# Patient Record
Sex: Female | Born: 1980 | Race: Black or African American | Hispanic: No | Marital: Single | Smoking: Current every day smoker
Health system: Southern US, Community
[De-identification: ages and names within clinical notes are randomized; demographics above are authoritative.]

## PROBLEM LIST (undated history)

## (undated) DIAGNOSIS — M4800 Spinal stenosis, site unspecified: Secondary | ICD-10-CM

## (undated) DIAGNOSIS — F29 Unspecified psychosis not due to a substance or known physiological condition: Secondary | ICD-10-CM

## (undated) DIAGNOSIS — M549 Dorsalgia, unspecified: Secondary | ICD-10-CM

## (undated) DIAGNOSIS — Z720 Tobacco use: Secondary | ICD-10-CM

## (undated) DIAGNOSIS — M1990 Unspecified osteoarthritis, unspecified site: Secondary | ICD-10-CM

## (undated) DIAGNOSIS — F32A Depression, unspecified: Secondary | ICD-10-CM

## (undated) DIAGNOSIS — D573 Sickle-cell trait: Secondary | ICD-10-CM

## (undated) DIAGNOSIS — F329 Major depressive disorder, single episode, unspecified: Secondary | ICD-10-CM

## (undated) DIAGNOSIS — J45909 Unspecified asthma, uncomplicated: Secondary | ICD-10-CM

## (undated) HISTORY — PX: CHOLECYSTECTOMY: SHX55

---
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
## 2003-04-07 ENCOUNTER — Inpatient Hospital Stay (HOSPITAL_COMMUNITY): Admission: AD | Admit: 2003-04-07 | Discharge: 2003-04-08 | Payer: Self-pay | Admitting: Obstetrics & Gynecology

## 2003-04-08 ENCOUNTER — Encounter: Payer: Self-pay | Admitting: Family Medicine

## 2015-11-15 ENCOUNTER — Encounter (HOSPITAL_COMMUNITY): Payer: Self-pay | Admitting: Emergency Medicine

## 2015-11-15 ENCOUNTER — Emergency Department (HOSPITAL_COMMUNITY)
Admission: EM | Admit: 2015-11-15 | Discharge: 2015-11-15 | Disposition: A | Discharge status: Home / Self Care | Payer: Medicaid Other | Attending: Physician Assistant | Admitting: Physician Assistant

## 2015-11-15 DIAGNOSIS — M545 Low back pain, unspecified: Secondary | ICD-10-CM

## 2015-11-15 HISTORY — DX: Dorsalgia, unspecified: M54.9

## 2015-11-15 HISTORY — DX: Unspecified osteoarthritis, unspecified site: M19.90

## 2015-11-15 MED ORDER — KETOROLAC TROMETHAMINE 60 MG/2ML IM SOLN
30.0000 mg | Freq: Once | INTRAMUSCULAR | Status: AC
  Administered 2015-11-15: 30 mg via INTRAMUSCULAR
  Filled 2015-11-15: qty 2

## 2015-11-15 MED ORDER — IBUPROFEN 800 MG PO TABS: 800.0000 mg | ORAL_TABLET | Freq: Three times a day (TID) | ORAL | Status: DC

## 2015-11-15 MED ORDER — METHOCARBAMOL 500 MG PO TABS: 500.0000 mg | ORAL_TABLET | Freq: Two times a day (BID) | ORAL | Status: DC

## 2015-11-15 NOTE — ED Notes (Signed)
Pt states she has a hx of back problems. Last night began having lower back pain that has been worsening throughout the day. Denies any incontinence, states shooting pain down right leg makes it tingle intermittently. Able to walk into triage.

## 2015-11-15 NOTE — Discharge Instructions (Signed)

## 2015-11-15 NOTE — ED Provider Notes (Signed)
CSN: [id number]     Arrival date & time 11/15/15  1322 History  By signing my name below, I, Phillis Haggis, attest that this documentation has been prepared under the direction and in the presence of Fayrene Helper, PA-C.  Electronically Signed: Phillis Haggis, ED Scribe. 11/15/2015. 1:54 PM.   Chief Complaint  Patient presents with  . Back Pain   The history is provided by the patient. No language interpreter was used.  HPI Comments: Norma Moreno is a 35 y.o. Female with a hx of spinal stenosis, back pain and arthritis who presents to the Emergency Department complaining of gradually worsening, intermittent, sharp,shooting, dull lower back pain that radiates down right leg onset one day ago. Pt has a hx of spinal stenosis that has caused intermittent pain over the past five years. Pt reports that she began a new job Monday that requires her to stand for long periods. She rates her pain 10/10 that worsens with standing and sitting. She has not taken anything for her pain PTA. She denies bladder or bowel incontinence, fever, groin numbness, hematuria, abdominal pain, hx of IV drug use, or hx of cancer.   Past Medical History  Diagnosis Date  . Arthritis   . Back pain    Past Surgical History  Procedure Laterality Date  . Cholecystectomy    . Cesarean section      x 4   History reviewed. No pertinent family history. Social History  Substance Use Topics  . Smoking status: None  . Smokeless tobacco: None  . Alcohol Use: None   OB History    No data available     Review of Systems  Constitutional: Negative for fever.  Gastrointestinal: Negative for abdominal pain.  Genitourinary: Negative for dysuria and hematuria.  Musculoskeletal: Positive for back pain.  Neurological: Negative for weakness and numbness.   Allergies  Review of patient's allergies indicates no known allergies.  Home Medications   Prior to Admission medications   Not on File   BP 119/72 mmHg  Pulse 73   Temp(Src) 97.8 F (36.6 C) (Oral)  Resp 16  SpO2 100% Physical Exam  Constitutional: She is oriented to person, place, and time. She appears well-developed and well-nourished.  HENT:  Head: Normocephalic and atraumatic.  Eyes: Conjunctivae and EOM are normal. Pupils are equal, round, and reactive to light.  Neck: Normal range of motion. Neck supple.  Musculoskeletal:       Right hip: Normal.       Left hip: Normal.       Right knee: Normal.       Left knee: Normal.       Right ankle: Normal.       Left ankle: Normal.  Tenderness to palpation to lumbar and paralumbar spine; no crepitus, step offs, or overlying skin changes; ambulating with a limp; NVI; brisk cap refill  Neurological: She is alert and oriented to person, place, and time. Coordination normal.  Intact patellar reflexes bilaterally; no foot drop; equal 5/5 strength bilaterally and sensation intact  Skin: Skin is warm and dry.  Psychiatric: She has a normal mood and affect. Her behavior is normal.  Nursing note and vitals reviewed.   ED Course  Procedures (including critical care time) DIAGNOSTIC STUDIES: Oxygen Saturation is 100% on RA, normal by my interpretation.    COORDINATION OF CARE: 1:52 PM-Discussed treatment plan which includes pain medication with pt at bedside and pt agreed to plan.    MDM   Patient with  back pain.  No neurological deficits and normal neuro exam.  Patient can walk but states is painful.  No loss of bowel or bladder control.  No concern for cauda equina.  No fever, night sweats, weight loss, h/o cancer, IVDU.  RICE protocol, Robaxin and ibuprofen indicated and discussed with patient. Will give referral to orthopedics  Final diagnoses:  Bilateral low back pain without sciatica   BP 119/72 mmHg  Pulse 73  Temp(Src) 97.8 F (36.6 C) (Oral)  Resp 16  SpO2 100%  I personally performed the services described in this documentation, which was scribed in my presence. The recorded  information has been reviewed and is accurate.      Fayrene HelperBowie Roneisha Stern, PA-C 11/15/15 1412  Courteney Lyn Mackuen, MD 11/15/15 1614

## 2015-11-15 NOTE — ED Notes (Signed)
Attempt to call PT back for triage. PT did not answer

## 2015-11-21 ENCOUNTER — Emergency Department (HOSPITAL_COMMUNITY): Payer: Medicaid Other

## 2015-11-21 ENCOUNTER — Emergency Department (HOSPITAL_COMMUNITY)
Admission: EM | Admit: 2015-11-21 | Discharge: 2015-11-21 | Disposition: A | Discharge status: Home / Self Care | Payer: Medicaid Other | Attending: Emergency Medicine | Admitting: Emergency Medicine

## 2015-11-21 ENCOUNTER — Encounter (HOSPITAL_COMMUNITY): Payer: Self-pay

## 2015-11-21 DIAGNOSIS — Z3202 Encounter for pregnancy test, result negative: Secondary | ICD-10-CM | POA: Insufficient documentation

## 2015-11-21 DIAGNOSIS — R69 Illness, unspecified: Secondary | ICD-10-CM

## 2015-11-21 DIAGNOSIS — Z79899 Other long term (current) drug therapy: Secondary | ICD-10-CM | POA: Insufficient documentation

## 2015-11-21 DIAGNOSIS — J111 Influenza due to unidentified influenza virus with other respiratory manifestations: Secondary | ICD-10-CM | POA: Diagnosis not present

## 2015-11-21 DIAGNOSIS — F17200 Nicotine dependence, unspecified, uncomplicated: Secondary | ICD-10-CM | POA: Insufficient documentation

## 2015-11-21 DIAGNOSIS — R509 Fever, unspecified: Secondary | ICD-10-CM | POA: Diagnosis present

## 2015-11-21 DIAGNOSIS — M1990 Unspecified osteoarthritis, unspecified site: Secondary | ICD-10-CM | POA: Diagnosis not present

## 2015-11-21 LAB — COMPREHENSIVE METABOLIC PANEL
ALBUMIN: 3.7 g/dL (ref 3.5–5.0)
ALT: 12 U/L — ABNORMAL LOW (ref 14–54)
ANION GAP: 8 (ref 5–15)
AST: 21 U/L (ref 15–41)
Alkaline Phosphatase: 50 U/L (ref 38–126)
BUN: 6 mg/dL (ref 6–20)
CHLORIDE: 111 mmol/L (ref 101–111)
CO2: 23 mmol/L (ref 22–32)
Calcium: 8.7 mg/dL — ABNORMAL LOW (ref 8.9–10.3)
Creatinine, Ser: 0.95 mg/dL (ref 0.44–1.00)
GFR calc Af Amer: >60 mL/min (ref >60)
Glucose, Bld: 90 mg/dL (ref 65–99)
POTASSIUM: 2.9 mmol/L — AB (ref 3.5–5.1)
Sodium: 142 mmol/L (ref 135–145)
TOTAL PROTEIN: 7.6 g/dL (ref 6.5–8.1)
Total Bilirubin: 0.2 mg/dL — ABNORMAL LOW (ref 0.3–1.2)

## 2015-11-21 LAB — CBC
HEMATOCRIT: 32.8 % — AB (ref 36.0–46.0)
HEMOGLOBIN: 11.2 g/dL — AB (ref 12.0–15.0)
MCH: 27.7 pg (ref 26.0–34.0)
MCHC: 34.1 g/dL (ref 30.0–36.0)
MCV: 81.2 fL (ref 78.0–100.0)
Platelets: 193 10*3/uL (ref 150–400)
RBC: 4.04 MIL/uL (ref 3.87–5.11)
RDW: 15.9 % — ABNORMAL HIGH (ref 11.5–15.5)
WBC: 5.6 10*3/uL (ref 4.0–10.5)

## 2015-11-21 LAB — URINALYSIS, ROUTINE W REFLEX MICROSCOPIC
Bilirubin Urine: NEGATIVE
Glucose, UA: NEGATIVE mg/dL
KETONES UR: NEGATIVE mg/dL
LEUKOCYTES UA: NEGATIVE
NITRITE: NEGATIVE
PH: 6 (ref 5.0–8.0)
Protein, ur: NEGATIVE mg/dL
SPECIFIC GRAVITY, URINE: 1.02 (ref 1.005–1.030)

## 2015-11-21 LAB — URINE MICROSCOPIC-ADD ON: WBC UA: NONE SEEN WBC/hpf (ref 0–5)

## 2015-11-21 LAB — I-STAT BETA HCG BLOOD, ED (MC, WL, AP ONLY): I-stat hCG, quantitative: <5 m[IU]/mL (ref <5)

## 2015-11-21 LAB — LIPASE, BLOOD: LIPASE: 42 U/L (ref 11–51)

## 2015-11-21 MED ORDER — ACETAMINOPHEN 500 MG PO TABS
1000.0000 mg | ORAL_TABLET | Freq: Once | ORAL | Status: AC
  Administered 2015-11-21: 1000 mg via ORAL
  Filled 2015-11-21: qty 2

## 2015-11-21 MED ORDER — SODIUM CHLORIDE 0.9 % IV BOLUS (SEPSIS)
1000.0000 mL | Freq: Once | INTRAVENOUS | Status: AC
  Administered 2015-11-21: 1000 mL via INTRAVENOUS

## 2015-11-21 MED ORDER — POTASSIUM CHLORIDE CRYS ER 20 MEQ PO TBCR
40.0000 meq | EXTENDED_RELEASE_TABLET | Freq: Once | ORAL | Status: AC
  Administered 2015-11-21: 40 meq via ORAL
  Filled 2015-11-21: qty 2

## 2015-11-21 MED ORDER — ONDANSETRON HCL 4 MG/2ML IJ SOLN
4.0000 mg | Freq: Once | INTRAMUSCULAR | Status: AC
  Administered 2015-11-21: 4 mg via INTRAVENOUS
  Filled 2015-11-21: qty 2

## 2015-11-21 MED ORDER — KETOROLAC TROMETHAMINE 30 MG/ML IJ SOLN
30.0000 mg | Freq: Once | INTRAMUSCULAR | Status: AC
  Administered 2015-11-21: 30 mg via INTRAVENOUS
  Filled 2015-11-21: qty 1

## 2015-11-21 NOTE — Discharge Instructions (Signed)

## 2015-11-21 NOTE — ED Notes (Signed)
Pt continues with back pain which she was seen for 6 days ago.  Pt states emesis starting today.  No abdominal pain.  No change in urination.  No fever

## 2015-11-21 NOTE — Progress Notes (Signed)
Patient listed as not having insurance or a pcp.  [name] spoke to patient at bedside.  Patient reports she has Medicaid insurance.  Pcp listed on patient's insurance card is located at the [facility].  Patient was not aware of this.  [facility] with [facility].  [facility] encouraged patient to call the DSS to see which pcp has been assigned to her.  Patient also provided phone number for the DSS, Medicaid transport and list of pc[place] who [place].  Patient thankful for services.  No further [name] needs at this time.

## 2015-11-21 NOTE — ED Provider Notes (Signed)
CSN: [id number]     Arrival date & time 11/21/15  1407 History   First MD Initiated Contact with Patient 11/21/15 1712     Chief Complaint  Patient presents with  . Back Pain  . Emesis     (Consider location/radiation/quality/duration/timing/severity/associated sxs/prior Treatment) Patient is a 35 y.o. female presenting with back pain, vomiting, and general illness. The history is provided by the patient.  Back Pain Associated symptoms: fever   Associated symptoms: no chest pain, no dysuria and no headaches   Emesis Associated symptoms: myalgias   Associated symptoms: no arthralgias, no chills and no headaches   Illness Severity:  Moderate Onset quality:  Gradual Duration:  2 days Timing:  Constant Progression:  Worsening Chronicity:  New Associated symptoms: congestion, cough, fever, myalgias and vomiting   Associated symptoms: no chest pain, no headaches, no nausea, no rhinorrhea, no shortness of breath and no wheezing    35 yo With a chief complaint of cough congestion fevers chills myalgias. Going on for the past couple days. Her son has the same illness. Has had some shortness of breath with it. Has been feeling generally ill. Was recently seen for right-sided low back pain with radiation down her leg. This is unchanged since that time. Denies loss of bowel or bladder.  Past Medical History  Diagnosis Date  . Arthritis   . Back pain    Past Surgical History  Procedure Laterality Date  . Cholecystectomy    . Cesarean section      x 4   History reviewed. No pertinent family history. Social History  Substance Use Topics  . Smoking status: Current Some Day Smoker  . Smokeless tobacco: None  . Alcohol Use: No   OB History    No data available     Review of Systems  Constitutional: Positive for fever. Negative for chills.  HENT: Positive for congestion. Negative for rhinorrhea.   Eyes: Negative for redness and visual disturbance.  Respiratory: Positive for  cough. Negative for shortness of breath and wheezing.   Cardiovascular: Negative for chest pain and palpitations.  Gastrointestinal: Positive for vomiting. Negative for nausea.  Genitourinary: Negative for dysuria and urgency.  Musculoskeletal: Positive for myalgias and back pain. Negative for arthralgias.  Skin: Negative for pallor and wound.  Neurological: Negative for dizziness and headaches.      Allergies  Review of patient's allergies indicates no known allergies.  Home Medications   Prior to Admission medications   Medication Sig Start Date End Date Taking? Authorizing Provider  acetaminophen (TYLENOL) 500 MG tablet Take 1,000 mg by mouth every 6 (six) hours as needed for moderate pain.   Yes Historical Provider, MD  ibuprofen (ADVIL,MOTRIN) 800 MG tablet Take 1 tablet (800 mg total) by mouth 3 (three) times daily. 11/15/15  Yes Fayrene Helper, PA-C  methocarbamol (ROBAXIN) 500 MG tablet Take 1 tablet (500 mg total) by mouth 2 (two) times daily. 11/15/15  Yes Fayrene Helper, PA-C   BP 119/58 mmHg  Pulse 78  Temp(Src) 98.7 F (37.1 C) (Oral)  Resp 18  SpO2 100%  LMP 11/19/2015 Physical Exam  Constitutional: She is oriented to person, place, and time. She appears well-developed and well-nourished. No distress.  HENT:  Head: Normocephalic and atraumatic.  Eyes: EOM are normal. Pupils are equal, round, and reactive to light.  Neck: Normal range of motion. Neck supple.  Cardiovascular: Normal rate and regular rhythm.  Exam reveals no gallop and no friction rub.   No murmur heard.  Pulmonary/Chest: Effort normal. She has no wheezes. She has no rales.  Abdominal: Soft. She exhibits no distension. There is no tenderness. There is no rebound.  Musculoskeletal: She exhibits no edema or tenderness.  Neurological: She is alert and oriented to person, place, and time.  Skin: Skin is warm and dry. She is not diaphoretic.  Psychiatric: She has a normal mood and affect. Her behavior is normal.   Nursing note and vitals reviewed.   ED Course  Procedures (including critical care time) Labs Review Labs Reviewed  COMPREHENSIVE METABOLIC PANEL - Abnormal; Notable for the following:    Potassium 2.9 (*)    Calcium 8.7 (*)    ALT 12 (*)    Total Bilirubin 0.2 (*)    All other components within normal limits  CBC - Abnormal; Notable for the following:    Hemoglobin 11.2 (*)    HCT 32.8 (*)    RDW 15.9 (*)    All other components within normal limits  URINALYSIS, ROUTINE W REFLEX MICROSCOPIC (NOT AT [facility]) - Abnormal; Notable for the following:    Hgb urine dipstick LARGE (*)    All other components within normal limits  URINE MICROSCOPIC-ADD ON - Abnormal; Notable for the following:    Squamous Epithelial / LPF 0-5 (*)    Bacteria, UA RARE (*)    All other components within normal limits  LIPASE, BLOOD  I-STAT BETA HCG BLOOD, ED ([facility], [facility], [facility] ONLY)    Imaging Review Dg Chest 2 View  11/21/2015  CLINICAL DATA:  Cough, fever and shortness of breath since yesterday, history asthma, smoking EXAM: CHEST  2 VIEW COMPARISON:  None FINDINGS: Normal heart size, mediastinal contours, and pulmonary vascularity. Mild peribronchial thickening. Question mild hyperinflation. No pulmonary infiltrate, pleural effusion, or pneumothorax. Bones unremarkable. IMPRESSION: Bronchitic changes without infiltrate. Electronically Signed   By: Ulyses Southward M.D.   On: 11/21/2015 18:54   I have personally reviewed and evaluated these images and lab results as part of my medical decision-making.   EKG Interpretation None      MDM   Final diagnoses:  Influenza-like illness    35 yo F with a flulike illness. Patient improved with IV fluids and Toradol and Tylenol. D/c home.    I have discussed the diagnosis/risks/treatment options with the patient and family and believe the pt to be eligible for discharge home to follow-up with PCP. We also discussed returning to the ED immediately if new or worsening  sx occur. We discussed the sx which are most concerning (e.g., sudden worsening pain, fever, inability to tolerate by mouth) that necessitate immediate return. Medications administered to the patient during their visit and any new prescriptions provided to the patient are listed below.  Medications given during this visit Medications  sodium chloride 0.9 % bolus 1,000 mL (0 mLs Intravenous Stopped 11/21/15 2026)  potassium chloride SA (K-DUR,KLOR-CON) CR tablet 40 mEq (40 mEq Oral Given 11/21/15 1918)  ketorolac (TORADOL) 30 MG/ML injection 30 mg (30 mg Intravenous Given 11/21/15 1918)  acetaminophen (TYLENOL) tablet 1,000 mg (1,000 mg Oral Given 11/21/15 1918)  ondansetron (ZOFRAN) injection 4 mg (4 mg Intravenous Given 11/21/15 1917)    Discharge Medication List as of 11/21/2015  6:58 PM      The patient appears reasonably screen and/or stabilized for discharge and I doubt any other medical condition or other [facility] requiring further screening, evaluation, or treatment in the ED at this time prior to discharge.      Jesusita Oka  Adela LankFloyd, DO 11/22/15 [id number]

## 2016-01-15 ENCOUNTER — Emergency Department (HOSPITAL_COMMUNITY)
Admission: EM | Admit: 2016-01-15 | Discharge: 2016-01-15 | Disposition: A | Discharge status: Home / Self Care | Payer: Medicaid Other | Attending: Emergency Medicine | Admitting: Emergency Medicine

## 2016-01-15 ENCOUNTER — Encounter (HOSPITAL_COMMUNITY): Payer: Self-pay | Admitting: Emergency Medicine

## 2016-01-15 DIAGNOSIS — Z79899 Other long term (current) drug therapy: Secondary | ICD-10-CM | POA: Insufficient documentation

## 2016-01-15 DIAGNOSIS — Z791 Long term (current) use of non-steroidal anti-inflammatories (NSAID): Secondary | ICD-10-CM | POA: Insufficient documentation

## 2016-01-15 DIAGNOSIS — F17210 Nicotine dependence, cigarettes, uncomplicated: Secondary | ICD-10-CM | POA: Diagnosis not present

## 2016-01-15 DIAGNOSIS — M1990 Unspecified osteoarthritis, unspecified site: Secondary | ICD-10-CM | POA: Insufficient documentation

## 2016-01-15 DIAGNOSIS — N764 Abscess of vulva: Secondary | ICD-10-CM | POA: Insufficient documentation

## 2016-01-15 DIAGNOSIS — J45909 Unspecified asthma, uncomplicated: Secondary | ICD-10-CM | POA: Insufficient documentation

## 2016-01-15 DIAGNOSIS — L0291 Cutaneous abscess, unspecified: Secondary | ICD-10-CM

## 2016-01-15 HISTORY — DX: Unspecified asthma, uncomplicated: J45.909

## 2016-01-15 MED ORDER — LIDOCAINE HCL (PF) 1 % IJ SOLN
2.0000 mL | Freq: Once | INTRAMUSCULAR | Status: AC
  Administered 2016-01-15: 2 mL via INTRADERMAL
  Filled 2016-01-15: qty 5

## 2016-01-15 NOTE — Discharge Instructions (Signed)
Abscess °An abscess is an infected area that contains a collection of pus and debris. It can occur in almost any part of the body. An abscess is also known as a furuncle or boil. °CAUSES  °An abscess occurs when tissue gets infected. This can occur from blockage of oil or sweat glands, infection of hair follicles, or a minor injury to the skin. As the body tries to fight the infection, pus collects in the area and creates pressure under the skin. This pressure causes pain. People with weakened immune systems have difficulty fighting infections and get certain abscesses more often.  °SYMPTOMS °Usually an abscess develops on the skin and becomes a painful mass that is red, warm, and tender. If the abscess forms under the skin, you may feel a moveable soft area under the skin. Some abscesses break open (rupture) on their own, but most will continue to get worse without care. The infection can spread deeper into the body and eventually into the bloodstream, causing you to feel ill.  °DIAGNOSIS  °Your caregiver will take your medical history and perform a physical exam. A sample of fluid may also be taken from the abscess to determine what is causing your infection. °TREATMENT  °Your caregiver may prescribe antibiotic medicines to fight the infection. However, taking antibiotics alone usually does not cure an abscess. Your caregiver may need to make a small cut (incision) in the abscess to drain the pus. In some cases, gauze is packed into the abscess to reduce pain and to continue draining the area. °HOME CARE INSTRUCTIONS  °· Only take over-the-counter or prescription medicines for pain, discomfort, or fever as directed by your caregiver. °· If you were prescribed antibiotics, take them as directed. Finish them even if you start to feel better. °· If gauze is used, follow your caregiver's directions for changing the gauze. °· To avoid spreading the infection: °· Keep your draining abscess covered with a  bandage. °· Wash your hands well. °· Do not share personal care items, towels, or whirlpools with others. °· Avoid skin contact with others. °· Keep your skin and clothes clean around the abscess. °· Keep all follow-up appointments as directed by your caregiver. °SEEK MEDICAL CARE IF:  °· You have increased pain, swelling, redness, fluid drainage, or bleeding. °· You have muscle aches, chills, or a general ill feeling. °· You have a fever. °MAKE SURE YOU:  °· Understand these instructions. °· Will watch your condition. °· Will get help right away if you are not doing well or get worse. °  °This information is not intended to replace advice given to you by your health care provider. Make sure you discuss any questions you have with your health care provider. °  °Document Released: 05/21/2005 Document Revised: 02/10/2012 Document Reviewed: 10/24/2011 °Elsevier Interactive Patient Education ©2016 [employer] ° °Incision and Drainage °Incision and drainage is a procedure in which a sac-like structure (cystic structure) is opened and drained. The area to be drained usually contains material such as pus, fluid, or blood.  °LET YOUR CAREGIVER KNOW ABOUT:  °· Allergies to medicine. °· Medicines taken, including vitamins, herbs, eyedrops, over-the-counter medicines, and creams. °· Use of steroids (by mouth or creams). °· Previous problems with anesthetics or numbing medicines. °· History of bleeding problems or blood clots. °· Previous surgery. °· Other health problems, including diabetes and kidney problems. °· Possibility of pregnancy, if this applies. °RISKS AND COMPLICATIONS °· Pain. °· Bleeding. °· Scarring. °· Infection. °BEFORE THE PROCEDURE  °  You may need to have an ultrasound or other imaging tests to see how large or deep your cystic structure is. Blood tests may also be used to determine if you have an infection or how severe the infection is. You may need to have a tetanus shot. °PROCEDURE  °The affected area  is cleaned with a cleaning fluid. The cyst area will then be numbed with a medicine (local anesthetic). A small incision will be made in the cystic structure. A syringe or catheter may be used to drain the contents of the cystic structure, or the contents may be squeezed out. The area will then be flushed with a cleansing solution. After cleansing the area, it is often gently packed with a gauze or another wound dressing. Once it is packed, it will be covered with gauze and tape or some other type of wound dressing.  °AFTER THE PROCEDURE  °· Often, you will be allowed to go home right after the procedure. °· You may be given antibiotic medicine to prevent or heal an infection. °· If the area was packed with gauze or some other wound dressing, you will likely need to come back in 1 to 2 days to get it removed. °· The area should heal in about 14 days. °  °This information is not intended to replace advice given to you by your health care provider. Make sure you discuss any questions you have with your health care provider. °  °Document Released: 02/04/2001 Document Revised: 02/10/2012 Document Reviewed: 10/06/2011 °Elsevier Interactive Patient Education ©2016 Elsevier Inc. ° °

## 2016-01-15 NOTE — ED Provider Notes (Signed)
CSN: [id number]     Arrival date & time 01/15/16  0941 History   First MD Initiated Contact with Patient 01/15/16 1002     Chief Complaint  Patient presents with  . vaginal cyst    Patient is a 35 y.o. female presenting with abscess.  Abscess Location:  Ano-genital Ano-genital abscess location:  Vulva Size:  1.5 Abscess quality: fluctuance and painful   Abscess quality: not draining, no induration, no redness and no warmth   Red streaking: no   Duration:  3 days Progression:  Worsening Chronicity:  New Relieved by:  None tried Ineffective treatments:  None tried Associated symptoms: no fever, no nausea and no vomiting   Risk factors: no prior abscess    Norma Moreno is a 35 year old female presenting with an abscess. Patient reports noticing a small lump on her left labia 3 days ago. It has progressively been growing in size and becoming more painful. She denies drainage from the area. She has not tried any home remedies for the abscess. Denies concern for STD exposure. Denies systemic symptoms including fever, chills, nausea or vomiting.  Past Medical History  Diagnosis Date  . Arthritis   . Back pain   . Asthma    Past Surgical History  Procedure Laterality Date  . Cholecystectomy    . Cesarean section      x 4   No family history on file. Social History  Substance Use Topics  . Smoking status: Current Every Day Smoker    Types: Cigars  . Smokeless tobacco: None  . Alcohol Use: No   OB History    No data available     Review of Systems  Constitutional: Negative for fever.  Gastrointestinal: Negative for nausea and vomiting.  All other systems reviewed and are negative.     Allergies  Review of patient's allergies indicates no known allergies.  Home Medications   Prior to Admission medications   Medication Sig Start Date End Date Taking? Authorizing Provider  albuterol (PROVENTIL HFA;VENTOLIN HFA) 108 (90 Base) MCG/ACT inhaler Inhale 1-2 puffs into the  lungs every 4 (four) hours as needed for wheezing or shortness of breath.   Yes Historical Provider, MD  acetaminophen (TYLENOL) 500 MG tablet Take 1,000 mg by mouth every 6 (six) hours as needed for moderate pain.    Historical Provider, MD  ibuprofen (ADVIL,MOTRIN) 800 MG tablet Take 1 tablet (800 mg total) by mouth 3 (three) times daily. 11/15/15   Fayrene Helper, PA-C  methocarbamol (ROBAXIN) 500 MG tablet Take 1 tablet (500 mg total) by mouth 2 (two) times daily. 11/15/15   Fayrene Helper, PA-C   BP 115/72 mmHg  Pulse 70  Temp(Src) 98.6 F (37 C) (Oral)  Resp 20  Ht  (1.651 m)  Wt 63.504 kg  BMI 23.30 kg/m2  SpO2 100%  LMP 01/15/2016 Physical Exam  Constitutional: She appears well-developed and well-nourished. No distress.  HENT:  Head: Normocephalic and atraumatic.  Right Ear: External ear normal.  Left Ear: External ear normal.  Eyes: Conjunctivae are normal. Right eye exhibits no discharge. Left eye exhibits no discharge. No scleral icterus.  Neck: Normal range of motion.  Cardiovascular: Normal rate.   Pulmonary/Chest: Effort normal.  Genitourinary:    There is lesion on the left labia.  Small, fluctuant, tender mass consistent with abscess noted to left superior labia. Approximately 1.5 cm in size. No surrounding induration or overlying erythema. No obvious vaginal discharge. Right labia normal.   Musculoskeletal: Normal  range of motion.  Moves all extremities spontaneously  Neurological: She is alert. Coordination normal.  Skin: Skin is warm and dry.  Psychiatric: She has a normal mood and affect. Her behavior is normal.  Nursing note and vitals reviewed.   ED Course  .Marland Kitchen.Incision and Drainage Date/Time: 01/15/2016 11:42 AM Performed by: Alveta HeimlichBARRETT, Ayodele Hartsock Authorized by: Alveta HeimlichBARRETT, Chou Busler Consent: Verbal consent obtained. Risks and benefits: risks, benefits and alternatives were discussed Consent given by: patient Patient understanding: patient states understanding of the  procedure being performed Patient consent: the patient's understanding of the procedure matches consent given Procedure consent: procedure consent matches procedure scheduled Required items: required blood products, implants, devices, and special equipment available Patient identity confirmed: verbally with patient Type: abscess Body area: anogenital Location details: vulva Anesthesia: local infiltration Local anesthetic: lidocaine 1% without epinephrine Anesthetic total: 1 ml Needle gauge: 18 Complexity: simple Drainage: purulent Drainage amount: moderate Wound treatment: wound left open Patient tolerance: Patient tolerated the procedure well with no immediate complications   (including critical care time) Labs Review Labs Reviewed - No data to display  Imaging Review No results found. I have personally reviewed and evaluated these images and lab results as part of my medical decision-making.   EKG Interpretation None      MDM   Final diagnoses:  Abscess   Patient presenting with skin abscess of the left labia amenable to incision and drainage.  Patient tolerated the procedure well. No packing or drain inserted. Antibiotic therapy is not indicated. Encouraged warm soaks at home and keeping the wound clean and dry. Instructed to go to PCP or urgent care in 2 days for wound recheck. Patient expresses understanding and is stable for discharge.      Alveta HeimlichStevi Laurann Mcmorris, PA-C 01/15/16 1142  Lorre NickAnthony Allen, MD 01/15/16 1714

## 2016-01-15 NOTE — ED Notes (Signed)
Patient states vaginal cyst that came up on Saturday.   Patient denies other symptoms.

## 2016-05-22 ENCOUNTER — Encounter (HOSPITAL_COMMUNITY): Payer: Self-pay | Admitting: Emergency Medicine

## 2016-05-22 ENCOUNTER — Emergency Department (HOSPITAL_COMMUNITY)
Admission: EM | Admit: 2016-05-22 | Discharge: 2016-05-22 | Disposition: A | Discharge status: Home / Self Care | Payer: Medicaid Other | Attending: Emergency Medicine | Admitting: Emergency Medicine

## 2016-05-22 ENCOUNTER — Emergency Department (HOSPITAL_COMMUNITY): Payer: Medicaid Other

## 2016-05-22 DIAGNOSIS — F17210 Nicotine dependence, cigarettes, uncomplicated: Secondary | ICD-10-CM | POA: Insufficient documentation

## 2016-05-22 DIAGNOSIS — A5901 Trichomonal vulvovaginitis: Secondary | ICD-10-CM | POA: Insufficient documentation

## 2016-05-22 DIAGNOSIS — J45901 Unspecified asthma with (acute) exacerbation: Secondary | ICD-10-CM | POA: Insufficient documentation

## 2016-05-22 DIAGNOSIS — Z720 Tobacco use: Secondary | ICD-10-CM

## 2016-05-22 LAB — CBC WITH DIFFERENTIAL/PLATELET
Basophils Absolute: 0 10*3/uL (ref 0.0–0.1)
Basophils Relative: 1 %
EOS PCT: 5 %
Eosinophils Absolute: 0.3 10*3/uL (ref 0.0–0.7)
HCT: 32.8 % — ABNORMAL LOW (ref 36.0–46.0)
Hemoglobin: 11 g/dL — ABNORMAL LOW (ref 12.0–15.0)
LYMPHS ABS: 3 10*3/uL (ref 0.7–4.0)
LYMPHS PCT: 49 %
MCH: 27 pg (ref 26.0–34.0)
MCHC: 33.5 g/dL (ref 30.0–36.0)
MCV: 80.4 fL (ref 78.0–100.0)
MONO ABS: 0.5 10*3/uL (ref 0.1–1.0)
MONOS PCT: 8 %
Neutro Abs: 2.3 10*3/uL (ref 1.7–7.7)
Neutrophils Relative %: 37 %
PLATELETS: 147 10*3/uL — AB (ref 150–400)
RBC: 4.08 MIL/uL (ref 3.87–5.11)
RDW: 16.2 % — AB (ref 11.5–15.5)
WBC: 6.1 10*3/uL (ref 4.0–10.5)

## 2016-05-22 LAB — BASIC METABOLIC PANEL
Anion gap: 11 (ref 5–15)
CO2: 19 mmol/L — ABNORMAL LOW (ref 22–32)
Calcium: 8.6 mg/dL — ABNORMAL LOW (ref 8.9–10.3)
Chloride: 106 mmol/L (ref 101–111)
Creatinine, Ser: 1.14 mg/dL — ABNORMAL HIGH (ref 0.44–1.00)
GFR calc Af Amer: >60 mL/min (ref >60)
GLUCOSE: 95 mg/dL (ref 65–99)
POTASSIUM: 3.1 mmol/L — AB (ref 3.5–5.1)
Sodium: 136 mmol/L (ref 135–145)

## 2016-05-22 LAB — URINALYSIS, ROUTINE W REFLEX MICROSCOPIC
BILIRUBIN URINE: NEGATIVE
Glucose, UA: NEGATIVE mg/dL
HGB URINE DIPSTICK: NEGATIVE
Ketones, ur: NEGATIVE mg/dL
Nitrite: NEGATIVE
PH: 7 (ref 5.0–8.0)
Protein, ur: NEGATIVE mg/dL
SPECIFIC GRAVITY, URINE: 1.006 (ref 1.005–1.030)

## 2016-05-22 LAB — URINE MICROSCOPIC-ADD ON: RBC / HPF: NONE SEEN RBC/hpf (ref 0–5)

## 2016-05-22 LAB — PREGNANCY, URINE: Preg Test, Ur: NEGATIVE

## 2016-05-22 LAB — BRAIN NATRIURETIC PEPTIDE: B Natriuretic Peptide: 58.5 pg/mL (ref 0.0–100.0)

## 2016-05-22 LAB — TROPONIN I: Troponin I: <0.03 ng/mL (ref <0.03)

## 2016-05-22 MED ORDER — PREDNISONE 10 MG (21) PO TBPK: 10.0000 mg | ORAL_TABLET | Freq: Every day | ORAL | 0 refills | Status: DC

## 2016-05-22 MED ORDER — ALBUTEROL SULFATE HFA 108 (90 BASE) MCG/ACT IN AERS
1.0000 | INHALATION_SPRAY | RESPIRATORY_TRACT | Status: DC | PRN
  Administered 2016-05-22: 2 via RESPIRATORY_TRACT
  Filled 2016-05-22: qty 6.7

## 2016-05-22 MED ORDER — ONDANSETRON HCL 4 MG/2ML IJ SOLN
INTRAMUSCULAR | Status: AC
  Administered 2016-05-22: 4 mg
  Filled 2016-05-22: qty 2

## 2016-05-22 MED ORDER — METRONIDAZOLE 500 MG PO TABS
2000.0000 mg | ORAL_TABLET | Freq: Once | ORAL | Status: AC
  Administered 2016-05-22: 2000 mg via ORAL
  Filled 2016-05-22: qty 4

## 2016-05-22 MED ORDER — ALBUTEROL (5 MG/ML) CONTINUOUS INHALATION SOLN
10.0000 mg/h | INHALATION_SOLUTION | Freq: Once | RESPIRATORY_TRACT | Status: AC
  Administered 2016-05-22: 10 mg/h via RESPIRATORY_TRACT
  Filled 2016-05-22: qty 20

## 2016-05-22 MED ORDER — AEROCHAMBER PLUS FLO-VU MEDIUM MISC
1.0000 | Freq: Once | Status: AC
  Administered 2016-05-22: 1
  Filled 2016-05-22: qty 1

## 2016-05-22 MED ORDER — LORAZEPAM 2 MG/ML IJ SOLN
0.5000 mg | Freq: Once | INTRAMUSCULAR | Status: DC
  Filled 2016-05-22: qty 1

## 2016-05-22 NOTE — ED Provider Notes (Signed)
[facility] DEPT Provider Note   CSN: [id number] Arrival date & time: 05/22/16  1904     History   Chief Complaint Chief Complaint  Patient presents with  . Shortness of Breath    HPI Norma Moreno is a 35 y.o. female.  Pt has been sob for the past few days.  She is out of her inhalers.  She does smoke.  Pt was given 5 mg of albuterol and 125 mg of solumedrol en route.  The pt said that she has been coughing up green sputum.      Past Medical History:  Diagnosis Date  . Arthritis   . Asthma   . Back pain     There are no active problems to display for this patient.   Past Surgical History:  Procedure Laterality Date  . CESAREAN SECTION     x 4  . CHOLECYSTECTOMY      OB History    No data available       Home Medications    Prior to Admission medications   Medication Sig Start Date End Date Taking? Authorizing Provider  acetaminophen (TYLENOL) 500 MG tablet Take 1,000 mg by mouth every 6 (six) hours as needed for moderate pain.    Historical Provider, MD  albuterol (PROVENTIL HFA;VENTOLIN HFA) 108 (90 Base) MCG/ACT inhaler Inhale 1-2 puffs into the lungs every 4 (four) hours as needed for wheezing or shortness of breath.    Historical Provider, MD  ibuprofen (ADVIL,MOTRIN) 800 MG tablet Take 1 tablet (800 mg total) by mouth 3 (three) times daily. 11/15/15   Fayrene Helper, PA-C  methocarbamol (ROBAXIN) 500 MG tablet Take 1 tablet (500 mg total) by mouth 2 (two) times daily. 11/15/15   Fayrene Helper, PA-C  predniSONE (STERAPRED UNI-PAK 21 TAB) 10 MG (21) TBPK tablet Take 1 tablet (10 mg total) by mouth daily. Take 6 tabs by mouth daily  for 2 days, then 5 tabs for 2 days, then 4 tabs for 2 days, then 3 tabs for 2 days, 2 tabs for 2 days, then 1 tab by mouth daily for 2 days 05/22/16   Jacalyn Lefevre, MD    Family History History reviewed. No pertinent family history.  Social History Social History  Substance Use Topics  . Smoking status: Current Every Day  Smoker    Types: Cigars  . Smokeless tobacco: Never Used  . Alcohol use No     Allergies   Review of patient's allergies indicates no known allergies.   Review of Systems Review of Systems  Respiratory: Positive for cough, shortness of breath and wheezing.   All other systems reviewed and are negative.    Physical Exam Updated Vital Signs BP 121/68 (BP Location: Right Arm)   Pulse 102   Temp 99.4 F (37.4 C) (Oral)   Resp 17   Ht 5\' 5"  (1.651 m)   Wt 145 lb (65.8 kg)   LMP 05/08/2016   SpO2 100%   BMI 24.13 kg/m   Physical Exam  Constitutional: She is oriented to person, place, and time. She appears well-developed and well-nourished. She appears distressed.  HENT:  Head: Normocephalic and atraumatic.  Right Ear: External ear normal.  Left Ear: External ear normal.  Nose: Nose normal.  Mouth/Throat: Oropharynx is clear and moist.  Eyes: Conjunctivae and EOM are normal. Pupils are equal, round, and reactive to light.  Neck: Normal range of motion. Neck supple.  Cardiovascular: Regular rhythm, normal heart sounds and intact distal pulses.  Tachycardia  present.   Pulmonary/Chest: She is in respiratory distress. She has wheezes.  Abdominal: Soft. Bowel sounds are normal.  Musculoskeletal: Normal range of motion.  Neurological: She is alert and oriented to person, place, and time.  Skin: Skin is warm.  Psychiatric: Her behavior is normal. Judgment and thought content normal. Her mood appears anxious.  Nursing note and vitals reviewed.    ED Treatments / Results  Labs (all labs ordered are listed, but only abnormal results are displayed) Labs Reviewed  BASIC METABOLIC PANEL - Abnormal; Notable for the following:       Result Value   Potassium 3.1 (*)    CO2 19 (*)    BUN <5 (*)    Creatinine, Ser 1.14 (*)    Calcium 8.6 (*)    All other components within normal limits  CBC WITH DIFFERENTIAL/PLATELET - Abnormal; Notable for the following:    Hemoglobin 11.0  (*)    HCT 32.8 (*)    RDW 16.2 (*)    Platelets 147 (*)    All other components within normal limits  URINALYSIS, ROUTINE W REFLEX MICROSCOPIC (NOT AT [facility]) - Abnormal; Notable for the following:    APPearance HAZY (*)    Leukocytes, UA LARGE (*)    All other components within normal limits  URINE MICROSCOPIC-ADD ON - Abnormal; Notable for the following:    Squamous Epithelial / LPF 6-30 (*)    Bacteria, UA FEW (*)    All other components within normal limits  TROPONIN I  BRAIN NATRIURETIC PEPTIDE  PREGNANCY, URINE    EKG  EKG Interpretation  Date/Time:  Thursday May 22 2016 19:09:25 EDT Ventricular Rate:  96 PR Interval:    QRS Duration: 90 QT Interval:  344 QTC Calculation: 435 R Axis:   73 Text Interpretation:  Sinus rhythm Confirmed by Particia NearingHAVILAND MD, Rony Ratz ([id number]) on 05/22/2016 7:12:30 PM       Radiology Dg Chest Port 1 View  Result Date: 05/22/2016 CLINICAL DATA:  35 year old presenting with three-day history of severe left-sided chest pain and shortness of breath. EXAM: PORTABLE CHEST 1 VIEW COMPARISON:  11/21/2015. FINDINGS: Cardiomediastinal silhouette unremarkable, unchanged. Lungs clear. Bronchovascular markings normal. Pulmonary vascularity normal. No visible pleural effusions. No pneumothorax. IMPRESSION: No acute cardiopulmonary disease. Electronically Signed   By: Hulan Saashomas  Lawrence M.D.   On: 05/22/2016 19:33    Procedures Procedures (including critical care time)  Medications Ordered in ED Medications  albuterol (PROVENTIL HFA;VENTOLIN HFA) 108 (90 Base) MCG/ACT inhaler 1-2 puff (not administered)  AEROCHAMBER PLUS FLO-VU MEDIUM MISC 1 each (not administered)  metroNIDAZOLE (FLAGYL) tablet 2,000 mg (not administered)  albuterol (PROVENTIL,VENTOLIN) solution continuous neb (10 mg/hr Nebulization Given 05/22/16 1927)  ondansetron (ZOFRAN) 4 MG/2ML injection (4 mg  Given 05/22/16 1925)     Initial Impression / Assessment and Plan / ED Course  I have  reviewed the triage vital signs and the nursing notes.  Pertinent labs & imaging results that were available during my care of the patient were reviewed by me and considered in my medical decision making (see chart for details).  Clinical Course    Pt is feeling much better.  She is given an albuterol inhaler and a spacer prior to d/c.  She is also told about the trich seen in her urine and she will be treated for that.  I spoke with pt about trying to stop smoking.  She said she will try.  Final Clinical Impressions(s) / ED Diagnoses   Final diagnoses:  Asthma exacerbation  Tobacco abuse  Trichomonas vaginitis    New Prescriptions New Prescriptions   PREDNISONE (STERAPRED UNI-PAK 21 TAB) 10 MG (21) TBPK TABLET    Take 1 tablet (10 mg total) by mouth daily. Take 6 tabs by mouth daily  for 2 days, then 5 tabs for 2 days, then 4 tabs for 2 days, then 3 tabs for 2 days, 2 tabs for 2 days, then 1 tab by mouth daily for 2 days     Jacalyn Lefevre, MD 05/22/16 2106

## 2016-05-22 NOTE — ED Triage Notes (Signed)
Pt presents to ER from home with [facility] for increased SOB and WOB x 3 days with mild fever, productive cough with green sputum and wheezes; pt reports hx of asthma; EMS gave 5mg  albuterol and 125mg  solumedrol IV

## 2016-06-26 ENCOUNTER — Encounter (HOSPITAL_COMMUNITY): Payer: Self-pay | Admitting: Emergency Medicine

## 2016-06-26 DIAGNOSIS — N201 Calculus of ureter: Secondary | ICD-10-CM | POA: Insufficient documentation

## 2016-06-26 DIAGNOSIS — N390 Urinary tract infection, site not specified: Secondary | ICD-10-CM | POA: Insufficient documentation

## 2016-06-26 DIAGNOSIS — F17290 Nicotine dependence, other tobacco product, uncomplicated: Secondary | ICD-10-CM | POA: Insufficient documentation

## 2016-06-26 DIAGNOSIS — J45909 Unspecified asthma, uncomplicated: Secondary | ICD-10-CM | POA: Insufficient documentation

## 2016-06-26 DIAGNOSIS — N1330 Unspecified hydronephrosis: Secondary | ICD-10-CM | POA: Insufficient documentation

## 2016-06-26 DIAGNOSIS — R319 Hematuria, unspecified: Secondary | ICD-10-CM | POA: Insufficient documentation

## 2016-06-26 LAB — URINE MICROSCOPIC-ADD ON

## 2016-06-26 LAB — URINALYSIS, ROUTINE W REFLEX MICROSCOPIC
Bilirubin Urine: NEGATIVE
Glucose, UA: NEGATIVE mg/dL
KETONES UR: NEGATIVE mg/dL
NITRITE: NEGATIVE
PROTEIN: 100 mg/dL — AB
Specific Gravity, Urine: 1.009 (ref 1.005–1.030)
pH: 6.5 (ref 5.0–8.0)

## 2016-06-26 NOTE — ED Triage Notes (Signed)
Pt c/o RLQ pain onset tonight.  Denies nausea, vomiting or dairrhea.

## 2016-06-27 ENCOUNTER — Emergency Department (HOSPITAL_COMMUNITY)
Admission: EM | Admit: 2016-06-27 | Discharge: 2016-06-27 | Disposition: A | Discharge status: Home / Self Care | Payer: Self-pay | Attending: Emergency Medicine | Admitting: Emergency Medicine

## 2016-06-27 ENCOUNTER — Emergency Department (HOSPITAL_COMMUNITY): Payer: Self-pay

## 2016-06-27 DIAGNOSIS — N201 Calculus of ureter: Secondary | ICD-10-CM

## 2016-06-27 LAB — COMPREHENSIVE METABOLIC PANEL
ALBUMIN: 3.1 g/dL — AB (ref 3.5–5.0)
ALK PHOS: 47 U/L (ref 38–126)
ALT: 10 U/L — ABNORMAL LOW (ref 14–54)
ANION GAP: 5 (ref 5–15)
AST: 17 U/L (ref 15–41)
BILIRUBIN TOTAL: 0.5 mg/dL (ref 0.3–1.2)
BUN: 7 mg/dL (ref 6–20)
CO2: 26 mmol/L (ref 22–32)
Calcium: 8.3 mg/dL — ABNORMAL LOW (ref 8.9–10.3)
Chloride: 105 mmol/L (ref 101–111)
Creatinine, Ser: 1.09 mg/dL — ABNORMAL HIGH (ref 0.44–1.00)
GFR calc Af Amer: >60 mL/min (ref >60)
GFR calc non Af Amer: >60 mL/min (ref >60)
GLUCOSE: 86 mg/dL (ref 65–99)
POTASSIUM: 3.4 mmol/L — AB (ref 3.5–5.1)
SODIUM: 136 mmol/L (ref 135–145)
TOTAL PROTEIN: 6.5 g/dL (ref 6.5–8.1)

## 2016-06-27 LAB — CBC WITH DIFFERENTIAL/PLATELET
BASOS ABS: 0 10*3/uL (ref 0.0–0.1)
BASOS PCT: 0 %
EOS ABS: 0.4 10*3/uL (ref 0.0–0.7)
Eosinophils Relative: 4 %
HEMATOCRIT: 29.1 % — AB (ref 36.0–46.0)
HEMOGLOBIN: 10 g/dL — AB (ref 12.0–15.0)
Lymphocytes Relative: 30 %
Lymphs Abs: 3 10*3/uL (ref 0.7–4.0)
MCH: 27 pg (ref 26.0–34.0)
MCHC: 34.4 g/dL (ref 30.0–36.0)
MCV: 78.6 fL (ref 78.0–100.0)
Monocytes Absolute: 0.6 10*3/uL (ref 0.1–1.0)
Monocytes Relative: 6 %
NEUTROS ABS: 5.9 10*3/uL (ref 1.7–7.7)
NEUTROS PCT: 60 %
Platelets: 138 10*3/uL — ABNORMAL LOW (ref 150–400)
RBC: 3.7 MIL/uL — ABNORMAL LOW (ref 3.87–5.11)
RDW: 15.9 % — AB (ref 11.5–15.5)
WBC: 10 10*3/uL (ref 4.0–10.5)

## 2016-06-27 LAB — PREGNANCY, URINE: Preg Test, Ur: NEGATIVE

## 2016-06-27 MED ORDER — ONDANSETRON HCL 4 MG/2ML IJ SOLN
4.0000 mg | Freq: Once | INTRAMUSCULAR | Status: AC
  Administered 2016-06-27: 4 mg via INTRAVENOUS
  Filled 2016-06-27: qty 2

## 2016-06-27 MED ORDER — SODIUM CHLORIDE 0.9 % IV SOLN
INTRAVENOUS | Status: DC
  Administered 2016-06-27: 02:00:00 via INTRAVENOUS

## 2016-06-27 MED ORDER — OXYCODONE-ACETAMINOPHEN 5-325 MG PO TABS
1.0000 | ORAL_TABLET | Freq: Once | ORAL | Status: AC
  Administered 2016-06-27: 1 via ORAL
  Filled 2016-06-27: qty 1

## 2016-06-27 MED ORDER — OXYCODONE-ACETAMINOPHEN 5-325 MG PO TABS: 1.0000 | ORAL_TABLET | Freq: Four times a day (QID) | ORAL | 0 refills | Status: DC | PRN

## 2016-06-27 MED ORDER — PHENAZOPYRIDINE HCL 200 MG PO TABS: 200.0000 mg | ORAL_TABLET | Freq: Three times a day (TID) | ORAL | 0 refills | Status: DC

## 2016-06-27 MED ORDER — DEXTROSE 5 % IV SOLN
1.0000 g | Freq: Once | INTRAVENOUS | Status: AC
  Administered 2016-06-27: 1 g via INTRAVENOUS
  Filled 2016-06-27: qty 10

## 2016-06-27 MED ORDER — MORPHINE SULFATE (PF) 4 MG/ML IV SOLN
4.0000 mg | Freq: Once | INTRAVENOUS | Status: AC
  Administered 2016-06-27: 4 mg via INTRAVENOUS
  Filled 2016-06-27: qty 1

## 2016-06-27 MED ORDER — TAMSULOSIN HCL 0.4 MG PO CAPS: 0.4000 mg | ORAL_CAPSULE | Freq: Every day | ORAL | 0 refills | Status: DC

## 2016-06-27 NOTE — ED Provider Notes (Signed)
[facility] Provider Note   CSN: [id number] Arrival date & time: 06/26/16  2224   Festivities  History   Chief Complaint Chief Complaint  Patient presents with  . Abdominal Pain    HPI Norma Moreno is a 34 y.o. female.  HPI   Patient with PMH of arthritis, asthma and back pain comes to the ER with complaints or severe right flank pain. It woke her up out of her sleep this morning and is accompanied by dysuria. Denies vaginal bleeding or discharge and denies hematuria. She has not had any nausea or vomiting. Her pain starts at the right flank and and radiates down towards her groin. She denies fevers as well. No back pain, URI symptoms, or epigastric pain.  Past Medical History:  Diagnosis Date  . Arthritis   . Asthma   . Back pain     There are no active problems to display for this patient.   Past Surgical History:  Procedure Laterality Date  . CESAREAN SECTION     x 4  . CHOLECYSTECTOMY      OB History    No data available       Home Medications    Prior to Admission medications   Medication Sig Start Date End Date Taking? Authorizing Provider  albuterol (PROVENTIL HFA;VENTOLIN HFA) 108 (90 Base) MCG/ACT inhaler Inhale 1-2 puffs into the lungs every 4 (four) hours as needed for wheezing or shortness of breath.   Yes Historical Provider, MD  ibuprofen (ADVIL,MOTRIN) 800 MG tablet Take 1 tablet (800 mg total) by mouth 3 (three) times daily. Patient not taking: Reported on 06/27/2016 11/15/15   Fayrene Helper, PA-C  methocarbamol (ROBAXIN) 500 MG tablet Take 1 tablet (500 mg total) by mouth 2 (two) times daily. Patient not taking: Reported on 06/27/2016 11/15/15   Fayrene Helper, PA-C  oxyCODONE-acetaminophen (PERCOCET/ROXICET) 5-325 MG tablet Take 1-2 tablets by mouth every 6 (six) hours as needed. 06/27/16   Adaline Neva Seat, PA-C  phenazopyridine (PYRIDIUM) 200 MG tablet Take 1 tablet (200 mg total) by mouth 3 (three) times daily. 06/27/16   Alonnie Neva Seat, PA-C    predniSONE (STERAPRED UNI-PAK 21 TAB) 10 MG (21) TBPK tablet Take 1 tablet (10 mg total) by mouth daily. Take 6 tabs by mouth daily  for 2 days, then 5 tabs for 2 days, then 4 tabs for 2 days, then 3 tabs for 2 days, 2 tabs for 2 days, then 1 tab by mouth daily for 2 days Patient not taking: Reported on 06/27/2016 05/22/16   Jacalyn Lefevre, MD  tamsulosin (FLOMAX) 0.4 MG CAPS capsule Take 1 capsule (0.4 mg total) by mouth daily. 06/27/16   Marlon Pel, PA-C    Family History No family history on file.  Social History Social History  Substance Use Topics  . Smoking status: Current Every Day Smoker    Types: Cigars  . Smokeless tobacco: Never Used  . Alcohol use No     Allergies   Review of patient's allergies indicates no known allergies.   Review of Systems Review of Systems  Review of Systems All other systems negative except as documented in the HPI. All pertinent positives and negatives as reviewed in the HPI.  Physical Exam Updated Vital Signs BP 124/69   Pulse 77   Temp 98.4 F (36.9 C) (Oral)   Resp 19   Ht 5\' 5"  (1.651 m)   Wt 65.8 kg   LMP 06/07/2016 (Exact Date)   SpO2 100%   BMI 24.13  kg/m   Physical Exam  Constitutional: She appears well-developed and well-nourished. She appears distressed (pain).  HENT:  Head: Normocephalic and atraumatic.  Eyes: Conjunctivae are normal. Pupils are equal, round, and reactive to light.  Neck: Trachea normal, normal range of motion and full passive range of motion without pain. Neck supple.  Cardiovascular: Normal rate, regular rhythm and normal pulses.   Pulmonary/Chest: Effort normal and breath sounds normal. Chest wall is not dull to percussion. She exhibits no tenderness, no crepitus, no edema, no deformity and no retraction.  Abdominal: Soft. Normal appearance and bowel sounds are normal. She exhibits no distension. There is tenderness in the right upper quadrant and right lower quadrant. There is guarding (voluntary)  and CVA tenderness (right). There is no rebound and negative Murphy's sign.  Musculoskeletal: Normal range of motion.  Neurological: She is alert. She has normal strength.  Skin: Skin is warm, dry and intact.  Psychiatric: She has a normal mood and affect. Her speech is normal and behavior is normal. Judgment and thought content normal. Cognition and memory are normal.     ED Treatments / Results  Labs (all labs ordered are listed, but only abnormal results are displayed) Labs Reviewed  CBC WITH DIFFERENTIAL/PLATELET - Abnormal; Notable for the following:       Result Value   RBC 3.70 (*)    Hemoglobin 10.0 (*)    HCT 29.1 (*)    RDW 15.9 (*)    Platelets 138 (*)    All other components within normal limits  COMPREHENSIVE METABOLIC PANEL - Abnormal; Notable for the following:    Potassium 3.4 (*)    Creatinine, Ser 1.09 (*)    Calcium 8.3 (*)    Albumin 3.1 (*)    ALT 10 (*)    All other components within normal limits  URINALYSIS, ROUTINE W REFLEX MICROSCOPIC (NOT AT [facility]) - Abnormal; Notable for the following:    APPearance CLOUDY (*)    Hgb urine dipstick LARGE (*)    Protein, ur 100 (*)    Leukocytes, UA LARGE (*)    All other components within normal limits  URINE MICROSCOPIC-ADD ON - Abnormal; Notable for the following:    Squamous Epithelial / LPF 0-5 (*)    Bacteria, UA FEW (*)    All other components within normal limits  URINE CULTURE  PREGNANCY, URINE  POC URINE PREG, ED    EKG  EKG Interpretation None       Radiology Ct Abdomen Pelvis Wo Contrast  Result Date: 06/27/2016 CLINICAL DATA:  Right flank pain. EXAM: CT ABDOMEN AND PELVIS WITHOUT CONTRAST TECHNIQUE: Multidetector CT imaging of the abdomen and pelvis was performed following the standard protocol without IV contrast. COMPARISON:  None. FINDINGS: Lower chest: Minimal atelectatic appearing posterior base opacities. Hepatobiliary: No focal liver abnormality is seen. Status post cholecystectomy. No  biliary dilatation. Pancreas: Unremarkable. No pancreatic ductal dilatation or surrounding inflammatory changes. Spleen: Normal in size without focal abnormality. Adrenals/Urinary Tract: Both adrenals are normal. There is right hydronephrosis and hydroureter. Probable 2 mm right ureteral calculus within 1 cm of the ureterovesical junction. This is seen to best advantage on the 1 mm sections. No other urinary calculi are evident. Urinary bladder is unremarkable. Stomach/Bowel: Stomach is remarkable only for a hiatal hernia. Appendix appears normal. No evidence of bowel wall thickening, distention, or inflammatory changes. Colon is unremarkable. Vascular/Lymphatic: No significant vascular findings are present. No enlarged abdominal or pelvic lymph nodes. Reproductive: Uterus and bilateral adnexa  are unremarkable. Other: No ascites.  Small fat containing umbilical hernia. Musculoskeletal: No acute or significant osseous findings. IMPRESSION: Right hydronephrosis and hydroureter, probably due to a 2 mm calculus just above the ureterovesical junction. Electronically Signed   By: Ellery Plunkaniel R Mitchell M.D.   On: 06/27/2016 05:53    Procedures Procedures (including critical care time)  Medications Ordered in ED Medications  0.9 %  sodium chloride infusion ( Intravenous New Bag/Given 06/27/16 0218)  morphine 4 MG/ML injection 4 mg (4 mg Intravenous Given 06/27/16 0218)  ondansetron (ZOFRAN) injection 4 mg (4 mg Intravenous Given 06/27/16 0218)  cefTRIAXone (ROCEPHIN) 1 g in dextrose 5 % 50 mL IVPB (0 g Intravenous Stopped 06/27/16 0248)  oxyCODONE-acetaminophen (PERCOCET/ROXICET) 5-325 MG per tablet 1 tablet (1 tablet Oral Given 06/27/16 0554)  ondansetron (ZOFRAN) injection 4 mg (4 mg Intravenous Given 06/27/16 0554)     Initial Impression / Assessment and Plan / ED Course  I have reviewed the triage vital signs and the nursing notes.  Pertinent labs & imaging results that were available during my care of the  patient were reviewed by me and considered in my medical decision making (see chart for details).  Clinical Course    Dr. Rollene FareLamboy with urology was consulted. In this particular case, given normal vital signs, no white count, no N/V/F the patient can be treated out patient and he does not recommend stent at this time with close follow-up. He recommends culturing urine, no abx at this time and flomax with pain control.  Final Clinical Impressions(s) / ED Diagnoses   Final diagnoses:  Right ureteral stone    New Prescriptions New Prescriptions   OXYCODONE-ACETAMINOPHEN (PERCOCET/ROXICET) 5-325 MG TABLET    Take 1-2 tablets by mouth every 6 (six) hours as needed.   PHENAZOPYRIDINE (PYRIDIUM) 200 MG TABLET    Take 1 tablet (200 mg total) by mouth 3 (three) times daily.   TAMSULOSIN (FLOMAX) 0.4 MG CAPS CAPSULE    Take 1 capsule (0.4 mg total) by mouth daily.     Marlon Peliffany Tex Conroy, PA-C 06/27/16 0609    Marlon Peliffany Bernard Slayden, PA-C 06/27/16 [id number]    Azalia BilisKevin Campos, MD 06/27/16 [id number]

## 2016-06-27 NOTE — ED Notes (Signed)
Patient transported to CT 

## 2016-06-29 LAB — URINE CULTURE: Culture: 70000 — AB

## 2016-06-30 ENCOUNTER — Telehealth (HOSPITAL_BASED_OUTPATIENT_CLINIC_OR_DEPARTMENT_OTHER): Payer: Self-pay | Admitting: Emergency Medicine

## 2016-06-30 NOTE — Progress Notes (Signed)
ED Antimicrobial Stewardship Positive Culture Follow Up   Norma Moreno is an 35 y.o. female who [facility] with a chief complaint of  Chief Complaint  Patient presents with  . Abdominal Pain    Recent Results (from the past 720 hour(s))  Urine culture     Status: Abnormal   Collection Time: 06/26/16 11:15 PM  Result Value Ref Range Status   Specimen Description URINE, RANDOM  Final   Special Requests NONE  Final   Culture 70,000 COLONIES/mL ESCHERICHIA COLI (A)  Final   Report Status 06/29/2016 FINAL  Final   Organism ID, Bacteria ESCHERICHIA COLI (A)  Final      Susceptibility   Escherichia coli - MIC*    AMPICILLIN >=32 RESISTANT Resistant     CEFAZOLIN <=4 SENSITIVE Sensitive     CEFTRIAXONE <=1 SENSITIVE Sensitive     CIPROFLOXACIN <=0.25 SENSITIVE Sensitive     GENTAMICIN <=1 SENSITIVE Sensitive     IMIPENEM <=0.25 SENSITIVE Sensitive     NITROFURANTOIN <=16 SENSITIVE Sensitive     TRIMETH/SULFA >=320 RESISTANT Resistant     AMPICILLIN/SULBACTAM 16 INTERMEDIATE Intermediate     PIP/TAZO <=4 SENSITIVE Sensitive     Extended ESBL NEGATIVE Sensitive     * 70,000 COLONIES/mL ESCHERICHIA COLI    [x]  Patient discharged originally without antimicrobial agent and treatment is now indicated. Urology did not want to treat in the ED, but wait on urine culture report. Urine culture positive for E coli.   New antibiotic prescription: Ciprofloxacin 500mg  po BID x 7 days  ED Provider: Harolyn RutherfordShawn Joy, PA  Allie BossierApryl Anderson, PharmD PGY1 Pharmacy Resident [phone]) 06/30/2016 8:46 AM

## 2016-06-30 NOTE — Telephone Encounter (Signed)
Post ED Visit - Positive Culture Follow-up: Successful Patient Follow-Up  Culture assessed and recommendations reviewed by: []  Norma Moreno, Pharm.D. []  Norma Moreno, Pharm.D., BCPS []  Norma Moreno, Pharm.D. []  Norma Moreno, Pharm.D., BCPS []  [name], [place].D., BCPS, AAHIVP []  Norma Moreno, Pharm.D., BCPS, AAHIVP []  Norma Moreno, Pharm.D. []  Norma Moreno, [place]arm[place]D. AReece Leader. Moreno Pharm D  Positive urine culture  [x]  Patient discharged without antimicrobial prescription and treatment is now indicated []  Organism is resistant to prescribed ED discharge antimicrobial []  Patient with positive blood cultures  Changes discussed with ED provider: Harolyn RutherfordShawn Joy PA New antibiotic prescription start Cipro 500mg  po bid x 7                                           [facility] [place]  Contacted patient, 06/30/16 1203   Norma Moreno, Norma Moreno 06/30/2016, 12:03 PM

## 2016-08-10 ENCOUNTER — Encounter (HOSPITAL_COMMUNITY): Payer: Self-pay | Admitting: Emergency Medicine

## 2016-08-10 ENCOUNTER — Emergency Department (HOSPITAL_COMMUNITY): Payer: Medicaid Other

## 2016-08-10 ENCOUNTER — Emergency Department (HOSPITAL_COMMUNITY)
Admission: EM | Admit: 2016-08-10 | Discharge: 2016-08-10 | Disposition: A | Discharge status: Home / Self Care | Payer: Medicaid Other | Attending: Emergency Medicine | Admitting: Emergency Medicine

## 2016-08-10 DIAGNOSIS — Y929 Unspecified place or not applicable: Secondary | ICD-10-CM | POA: Insufficient documentation

## 2016-08-10 DIAGNOSIS — F17290 Nicotine dependence, other tobacco product, uncomplicated: Secondary | ICD-10-CM | POA: Insufficient documentation

## 2016-08-10 DIAGNOSIS — Y939 Activity, unspecified: Secondary | ICD-10-CM | POA: Insufficient documentation

## 2016-08-10 DIAGNOSIS — S93501A Unspecified sprain of right great toe, initial encounter: Secondary | ICD-10-CM | POA: Insufficient documentation

## 2016-08-10 DIAGNOSIS — S93509A Unspecified sprain of unspecified toe(s), initial encounter: Secondary | ICD-10-CM

## 2016-08-10 DIAGNOSIS — J45909 Unspecified asthma, uncomplicated: Secondary | ICD-10-CM | POA: Insufficient documentation

## 2016-08-10 DIAGNOSIS — M79674 Pain in right toe(s): Secondary | ICD-10-CM

## 2016-08-10 DIAGNOSIS — W2203XA Walked into furniture, initial encounter: Secondary | ICD-10-CM | POA: Insufficient documentation

## 2016-08-10 DIAGNOSIS — Y999 Unspecified external cause status: Secondary | ICD-10-CM | POA: Insufficient documentation

## 2016-08-10 DIAGNOSIS — Z79899 Other long term (current) drug therapy: Secondary | ICD-10-CM | POA: Insufficient documentation

## 2016-08-10 MED ORDER — IBUPROFEN 400 MG PO TABS
600.0000 mg | ORAL_TABLET | Freq: Once | ORAL | Status: AC
  Administered 2016-08-10: 600 mg via ORAL
  Filled 2016-08-10: qty 1

## 2016-08-10 NOTE — ED Provider Notes (Signed)
[facility] Provider Note   CSN: [id number] Arrival date & time: 08/10/16  1343 By signing my name below, I, Bridgette HabermannMaria Tan, attest that this documentation has been prepared under the direction and in the presence of Alvira MondayErin Artemus Romanoff, MD. Electronically Signed: Bridgette HabermannMaria Tan, ED Scribe. 08/10/16. 2:08 PM.  History   Chief Complaint Chief Complaint  Patient presents with  . Foot Pain   HPI Comments: Norma Moreno is a 35 y.o. female with h/o asthma who presents to the Emergency Department complaining of throbbing, 10/10 right great toe pain s/p mechanical injury last night. Pt states she struck her toe against a dresser and heard a "crack". Pain is exacerbated with bearing weight; pt is ambulatory with a limp. She has not tried any OTC medications PTA. Denies any additional injuries. Pt further denies fever, chills, numbness, or any other associated symptoms.  The history is provided by the patient. No language interpreter was used.    Past Medical History:  Diagnosis Date  . Arthritis   . Asthma   . Back pain     There are no active problems to display for this patient.   Past Surgical History:  Procedure Laterality Date  . CESAREAN SECTION     x 4  . CHOLECYSTECTOMY      OB History    No data available       Home Medications    Prior to Admission medications   Medication Sig Start Date End Date Taking? Authorizing Provider  albuterol (PROVENTIL HFA;VENTOLIN HFA) 108 (90 Base) MCG/ACT inhaler Inhale 1-2 puffs into the lungs every 4 (four) hours as needed for wheezing or shortness of breath.    Historical Provider, MD  ibuprofen (ADVIL,MOTRIN) 800 MG tablet Take 1 tablet (800 mg total) by mouth 3 (three) times daily. Patient not taking: Reported on 06/27/2016 11/15/15   Fayrene HelperBowie Tran, PA-C  methocarbamol (ROBAXIN) 500 MG tablet Take 1 tablet (500 mg total) by mouth 2 (two) times daily. Patient not taking: Reported on 06/27/2016 11/15/15   Fayrene HelperBowie Tran, PA-C    oxyCODONE-acetaminophen (PERCOCET/ROXICET) 5-325 MG tablet Take 1-2 tablets by mouth every 6 (six) hours as needed. 06/27/16   Larken Neva SeatGreene, PA-C  phenazopyridine (PYRIDIUM) 200 MG tablet Take 1 tablet (200 mg total) by mouth 3 (three) times daily. 06/27/16   Caroline Neva SeatGreene, PA-C  predniSONE (STERAPRED UNI-PAK 21 TAB) 10 MG (21) TBPK tablet Take 1 tablet (10 mg total) by mouth daily. Take 6 tabs by mouth daily  for 2 days, then 5 tabs for 2 days, then 4 tabs for 2 days, then 3 tabs for 2 days, 2 tabs for 2 days, then 1 tab by mouth daily for 2 days Patient not taking: Reported on 06/27/2016 05/22/16   Jacalyn LefevreJulie Haviland, MD  tamsulosin (FLOMAX) 0.4 MG CAPS capsule Take 1 capsule (0.4 mg total) by mouth daily. 06/27/16   Marlon Peliffany Greene, PA-C    Family History History reviewed. No pertinent family history.  Social History Social History  Substance Use Topics  . Smoking status: Current Every Day Smoker    Types: Cigars  . Smokeless tobacco: Never Used  . Alcohol use No     Allergies   Patient has no known allergies.   Review of Systems Review of Systems  Constitutional: Negative for chills and fever.  Musculoskeletal: Positive for arthralgias, joint swelling and myalgias.  Neurological: Negative for numbness.  All other systems reviewed and are negative.    Physical Exam Updated Vital Signs BP 108/72 (BP Location:  Right Arm)   Pulse 72   Temp 98 F (36.7 C) (Oral)   Resp 18   SpO2 98%   Physical Exam  Constitutional: She appears well-developed and well-nourished.  HENT:  Head: Normocephalic.  Eyes: Conjunctivae are normal.  Cardiovascular: Normal rate.   Pulses:      Dorsalis pedis pulses are 2+ on the right side.       Posterior tibial pulses are 2+ on the right side.  Pulmonary/Chest: Effort normal. No respiratory distress.  Abdominal: She exhibits no distension.  Musculoskeletal: Normal range of motion. She exhibits edema and tenderness.  Tenderness and swelling in  the middle of right great toe.  Neurological: She is alert.  Skin: Skin is warm and dry.  Psychiatric: She has a normal mood and affect. Her behavior is normal.  Nursing note and vitals reviewed.    ED Treatments / Results  DIAGNOSTIC STUDIES: Oxygen Saturation is 98% on RA, normal by my interpretation.    COORDINATION OF CARE: 2:06 PM Discussed treatment plan with pt at bedside which includes x-ray and pt agreed to plan.  Labs (all labs ordered are listed, but only abnormal results are displayed) Labs Reviewed - No data to display  EKG  EKG Interpretation None       Radiology Dg Foot Complete Right  Result Date: 08/10/2016 CLINICAL DATA:  Hit right foot on dresser last night. Great toe pain. EXAM: RIGHT FOOT COMPLETE - 3+ VIEW COMPARISON:  None. FINDINGS: There is no evidence of fracture or dislocation. There is no evidence of arthropathy or other focal bone abnormality. Soft tissues are unremarkable. IMPRESSION: Negative. Electronically Signed   By: Charlett NoseKevin  Dover M.D.   On: 08/10/2016 15:04    Procedures Procedures (including critical care time)  Medications Ordered in ED Medications  ibuprofen (ADVIL,MOTRIN) tablet 600 mg (600 mg Oral Given 08/10/16 1411)     Initial Impression / Assessment and Plan / ED Course  I have reviewed the triage vital signs and the nursing notes.  Pertinent labs & imaging results that were available during my care of the patient were reviewed by me and considered in my medical decision making (see chart for details).  Clinical Course    35 year old female presents with right great toe pain after hitting her time in a dresser last night. Given traumatic history, significant erythema, have low suspicion for septic arthritis or gout. X-ray was done showing a sinus fracture. Suspect sprain of the right great toe. Patient is placed in a postop shoe for comfort, recommend ibuprofen, elevation and ice.  Final Clinical Impressions(s) / ED  Diagnoses   Final diagnoses:  Pain of right great toe  Sprain of toe, initial encounter    New Prescriptions New Prescriptions   No medications on file   I personally performed the services described in this documentation, which was scribed in my presence. The recorded information has been reviewed and is accurate.     Alvira MondayErin Maalik Pinn, MD 08/10/16 1524

## 2016-08-10 NOTE — ED Notes (Signed)
Returned from xray

## 2016-08-10 NOTE — ED Notes (Signed)
Patient transported to X-ray via wheelchair 

## 2016-08-10 NOTE — ED Triage Notes (Signed)
Pt sts right great toe pain after hitting last night

## 2016-10-26 ENCOUNTER — Emergency Department (HOSPITAL_COMMUNITY)
Admission: EM | Admit: 2016-10-26 | Discharge: 2016-10-26 | Disposition: A | Discharge status: Home / Self Care | Payer: Medicaid Other | Attending: Emergency Medicine | Admitting: Emergency Medicine

## 2016-10-26 ENCOUNTER — Emergency Department (HOSPITAL_COMMUNITY): Payer: Medicaid Other

## 2016-10-26 ENCOUNTER — Encounter (HOSPITAL_COMMUNITY): Payer: Self-pay

## 2016-10-26 DIAGNOSIS — J45909 Unspecified asthma, uncomplicated: Secondary | ICD-10-CM | POA: Diagnosis not present

## 2016-10-26 DIAGNOSIS — R0789 Other chest pain: Secondary | ICD-10-CM | POA: Insufficient documentation

## 2016-10-26 DIAGNOSIS — S9032XA Contusion of left foot, initial encounter: Secondary | ICD-10-CM | POA: Diagnosis not present

## 2016-10-26 DIAGNOSIS — Y929 Unspecified place or not applicable: Secondary | ICD-10-CM | POA: Diagnosis not present

## 2016-10-26 DIAGNOSIS — Y9389 Activity, other specified: Secondary | ICD-10-CM | POA: Insufficient documentation

## 2016-10-26 DIAGNOSIS — F17290 Nicotine dependence, other tobacco product, uncomplicated: Secondary | ICD-10-CM | POA: Insufficient documentation

## 2016-10-26 DIAGNOSIS — W208XXA Other cause of strike by thrown, projected or falling object, initial encounter: Secondary | ICD-10-CM | POA: Diagnosis not present

## 2016-10-26 DIAGNOSIS — S99922A Unspecified injury of left foot, initial encounter: Secondary | ICD-10-CM | POA: Diagnosis present

## 2016-10-26 DIAGNOSIS — Y999 Unspecified external cause status: Secondary | ICD-10-CM | POA: Diagnosis not present

## 2016-10-26 MED ORDER — NAPROXEN 500 MG PO TABS: 500.0000 mg | ORAL_TABLET | Freq: Two times a day (BID) | ORAL | 0 refills | Status: DC

## 2016-10-26 MED ORDER — CYCLOBENZAPRINE HCL 5 MG PO TABS: 5.0000 mg | ORAL_TABLET | Freq: Three times a day (TID) | ORAL | 0 refills | Status: DC | PRN

## 2016-10-26 NOTE — ED Notes (Signed)
Declined W/C at D/C and was escorted to lobby by RN. 

## 2016-10-26 NOTE — ED Triage Notes (Signed)
Patient complains of left anterior chest wall pain pain and left foot pain x 1 day. States that she lifted weights yesterday and pain started today with any movement or inspiration, dropped weight on foot as well, pain with ambulation

## 2016-10-26 NOTE — ED Provider Notes (Signed)
[facility] Provider Note   CSN: [id number] Arrival date & time: 10/26/16  1643   By signing my name below, I, Soijett Blue, attest that this documentation has been prepared under the direction and in the presence of Kerrie Buffalo, NP Electronically Signed: Soijett Blue, ED Scribe. 10/26/16. 5:35 PM.  History   Chief Complaint Chief Complaint  Patient presents with  . foot pain/ chest wall pain    HPI Norma Moreno is a 36 y.o. female with a PMHx of arthritis, who presents to the Emergency Department complaining of left foot pain onset last night. Pt reports associated left foot swelling and gradual onset left anterior chest wall pain x last night. Pt has not tried any medications for the relief of her symptoms. She states that she was weightlifting when she dropped a 40 lb weight on her left foot prior to the onset of her symptoms. Pt reports that she had left anterior chest wall pain after lifting the 40 lb dumbbell. She denies nausea, vomiting, SOB, fever, chills, and any other symptoms.     The history is provided by the patient. No language interpreter was used.    Past Medical History:  Diagnosis Date  . Arthritis   . Asthma   . Back pain     There are no active problems to display for this patient.   Past Surgical History:  Procedure Laterality Date  . CESAREAN SECTION     x 4  . CHOLECYSTECTOMY      OB History    No data available       Home Medications    Prior to Admission medications   Medication Sig Start Date End Date Taking? Authorizing Provider  albuterol (PROVENTIL HFA;VENTOLIN HFA) 108 (90 Base) MCG/ACT inhaler Inhale 1-2 puffs into the lungs every 4 (four) hours as needed for wheezing or shortness of breath.    Historical Provider, MD  cyclobenzaprine (FLEXERIL) 5 MG tablet Take 1 tablet (5 mg total) by mouth 3 (three) times daily as needed for muscle spasms. 10/26/16   Shacora Zynda Orlene Och, NP  naproxen (NAPROSYN) 500 MG tablet Take 1 tablet (500 mg  total) by mouth 2 (two) times daily. 10/26/16   Jacelynn Hayton Orlene Och, NP  oxyCODONE-acetaminophen (PERCOCET/ROXICET) 5-325 MG tablet Take 1-2 tablets by mouth every 6 (six) hours as needed. 06/27/16   Vendetta Neva Seat, PA-C  phenazopyridine (PYRIDIUM) 200 MG tablet Take 1 tablet (200 mg total) by mouth 3 (three) times daily. 06/27/16   Jaquila Neva Seat, PA-C  tamsulosin (FLOMAX) 0.4 MG CAPS capsule Take 1 capsule (0.4 mg total) by mouth daily. 06/27/16   Marlon Pel, PA-C    Family History No family history on file.  Social History Social History  Substance Use Topics  . Smoking status: Current Every Day Smoker    Types: Cigars  . Smokeless tobacco: Never Used  . Alcohol use No     Allergies   Patient has no known allergies.   Review of Systems Review of Systems  Constitutional: Negative for chills and fever.  Respiratory: Negative for shortness of breath.        +Chest wall tenderness  Gastrointestinal: Negative for nausea and vomiting.  Musculoskeletal: Positive for arthralgias (left foot) and joint swelling (left foot).  All other systems reviewed and are negative.    Physical Exam Updated Vital Signs BP (!) 106/54 (BP Location: Left Arm)   Pulse 71   Temp 98 F (36.7 C) (Oral)   Resp 18   LMP  09/29/2016   SpO2 100%   Physical Exam  Constitutional: She is oriented to person, place, and time. She appears well-developed and well-nourished. No distress.  HENT:  Head: Normocephalic and atraumatic.  Eyes: EOM are normal.  Neck: Neck supple.  Cardiovascular: Normal rate, regular rhythm and normal heart sounds.  Exam reveals no gallop and no friction rub.   No murmur heard. Pulmonary/Chest: Effort normal. No respiratory distress. She has wheezes. She has no rales. She exhibits tenderness.  Occasional inspiratory wheezes. Chest wall pain with palpation.   Abdominal: Soft. She exhibits no distension. There is no tenderness.  Musculoskeletal: Normal range of motion.       Left  foot: There is tenderness and swelling.  Tenderness, swelling, and ecchymosis to 4th and 5th digit of left foot. Tenderness to lateral aspect of left foot.   Neurological: She is alert and oriented to person, place, and time.  Skin: Skin is warm and dry.  Psychiatric: She has a normal mood and affect. Her behavior is normal.  Nursing note and vitals reviewed.    ED Treatments / Results  DIAGNOSTIC STUDIES: Oxygen Saturation is 100% on RA, nl by my interpretation.    COORDINATION OF CARE: 5:25 PM Discussed treatment plan with pt at bedside which includes left foot xray, CXR, and pt agreed to plan.    Radiology Dg Chest 2 View  Result Date: 10/26/2016 CLINICAL DATA:  Chest pain, dyspnea, asthma and smoking history EXAM: CHEST  2 VIEW COMPARISON:  05/22/2016 FINDINGS: The heart size and mediastinal contours are within normal limits. Both lungs are clear. The visualized skeletal structures are unremarkable. IMPRESSION: No active cardiopulmonary disease. Electronically Signed   By: Tollie Ethavid  Kwon M.D.   On: 10/26/2016 18:10   Dg Foot Complete Left  Result Date: 10/26/2016 CLINICAL DATA:  Left foot pain after dropping weight on left foot last evening. Pain over the fourth and fifth toes. EXAM: LEFT FOOT - COMPLETE 3+ VIEW COMPARISON:  None. FINDINGS: There is no evidence of fracture or dislocation. There is no evidence of arthropathy or other focal bone abnormality. Soft tissues are unremarkable. IMPRESSION: No acute osseous abnormality or malalignment of the left foot. Electronically Signed   By: Tollie Ethavid  Kwon M.D.   On: 10/26/2016 17:26    Procedures Procedures (including critical care time)  Medications Ordered in ED Medications - No data to display   Initial Impression / Assessment and Plan / ED Course  I have reviewed the triage vital signs and the nursing notes.  Patient X-Ray negative for obvious fracture or dislocation. CXR negative for active cardiopulmonary disease. Pt advised to  follow up with her PCP or return here for worsening symptoms. Patient given ace wrap while in ED. Will discharge home with naprosyn and flexeril Rx. Conservative therapy recommended and discussed. Patient will be discharged home & is agreeable with above plan. Returns precautions discussed. Pt appears safe for discharge.  Final Clinical Impressions(s) / ED Diagnoses   Final diagnoses:  Chest wall pain  Contusion of left foot, initial encounter    New Prescriptions Discharge Medication List as of 10/26/2016  6:23 PM    START taking these medications   Details  cyclobenzaprine (FLEXERIL) 5 MG tablet Take 1 tablet (5 mg total) by mouth 3 (three) times daily as needed for muscle spasms., Starting Sun 10/26/2016, Print    naproxen (NAPROSYN) 500 MG tablet Take 1 tablet (500 mg total) by mouth 2 (two) times daily., Starting Sun 10/26/2016, Print  I personally performed the services described in this documentation, which was scribed in my presence. The recorded information has been reviewed and is accurate.     Milligan, NP 10/26/16 1919    Rolan Bucco, MD 10/26/16 [phone]

## 2016-11-07 ENCOUNTER — Encounter (HOSPITAL_COMMUNITY): Payer: Self-pay | Admitting: *Deleted

## 2016-11-07 ENCOUNTER — Emergency Department (HOSPITAL_COMMUNITY): Payer: Medicaid Other

## 2016-11-07 ENCOUNTER — Emergency Department (HOSPITAL_COMMUNITY)
Admission: EM | Admit: 2016-11-07 | Discharge: 2016-11-08 | Disposition: A | Discharge status: Home / Self Care | Payer: Medicaid Other | Attending: Emergency Medicine | Admitting: Emergency Medicine

## 2016-11-07 DIAGNOSIS — R079 Chest pain, unspecified: Secondary | ICD-10-CM | POA: Diagnosis present

## 2016-11-07 DIAGNOSIS — F17290 Nicotine dependence, other tobacco product, uncomplicated: Secondary | ICD-10-CM | POA: Diagnosis not present

## 2016-11-07 DIAGNOSIS — Z79899 Other long term (current) drug therapy: Secondary | ICD-10-CM | POA: Insufficient documentation

## 2016-11-07 DIAGNOSIS — R0789 Other chest pain: Secondary | ICD-10-CM | POA: Diagnosis not present

## 2016-11-07 DIAGNOSIS — J45909 Unspecified asthma, uncomplicated: Secondary | ICD-10-CM | POA: Diagnosis not present

## 2016-11-07 LAB — BASIC METABOLIC PANEL
ANION GAP: 6 (ref 5–15)
BUN: 6 mg/dL (ref 6–20)
CHLORIDE: 107 mmol/L (ref 101–111)
CO2: 25 mmol/L (ref 22–32)
Calcium: 8.5 mg/dL — ABNORMAL LOW (ref 8.9–10.3)
Creatinine, Ser: 0.98 mg/dL (ref 0.44–1.00)
GFR calc Af Amer: >60 mL/min (ref >60)
GLUCOSE: 100 mg/dL — AB (ref 65–99)
POTASSIUM: 3.5 mmol/L (ref 3.5–5.1)
Sodium: 138 mmol/L (ref 135–145)

## 2016-11-07 LAB — CBC
HEMATOCRIT: 31.1 % — AB (ref 36.0–46.0)
HEMOGLOBIN: 10.5 g/dL — AB (ref 12.0–15.0)
MCH: 26.9 pg (ref 26.0–34.0)
MCHC: 33.8 g/dL (ref 30.0–36.0)
MCV: 79.7 fL (ref 78.0–100.0)
Platelets: 139 10*3/uL — ABNORMAL LOW (ref 150–400)
RBC: 3.9 MIL/uL (ref 3.87–5.11)
RDW: 16 % — AB (ref 11.5–15.5)
WBC: 5.9 10*3/uL (ref 4.0–10.5)

## 2016-11-07 LAB — TROPONIN I: Troponin I: <0.03 ng/mL (ref <0.03)

## 2016-11-07 MED ORDER — KETOROLAC TROMETHAMINE 60 MG/2ML IM SOLN
60.0000 mg | Freq: Once | INTRAMUSCULAR | Status: AC
  Administered 2016-11-08: 60 mg via INTRAMUSCULAR
  Filled 2016-11-07: qty 2

## 2016-11-07 NOTE — ED Triage Notes (Signed)
Pt c/o Cp onset x 3 wks under the L breast, pt c/o intermittent SOB, pt denies n/v/d, pt denis cardiac hx, A&O x4

## 2016-11-07 NOTE — ED Provider Notes (Signed)
[facility] Provider Note   CSN: [id number] Arrival date & time: 11/07/16  1818 By signing my name below, I, Levon Hedger, attest that this documentation has been prepared under the direction and in the presence of Dione Booze, MD . Electronically Signed: Levon Hedger, Scribe. 11/07/2016. 11:52 PM.   History   Chief Complaint Chief Complaint  Patient presents with  . Chest Pain    HPI Norma Moreno is a 36 y.o. female with a history of arthritis and asthma who presents to the Emergency Department complaining of gradually worsening, persistent chest pain onset three weeks ago. She states "every time my heart beats, it hurts." Pt describes this as a 10/10 squeezing pain that is exacerbated by laying down, walking and breathing. No OTC treatments tried PTA. She reports associated shortness of breath with exertion and chills.  She endorses tobacco use and states she smokes 4 or 5 Black and Mild's per day. Pt's maternal grandmother died at 59 from a heart attack. No personal hx of DM, HTN or HLD. She is not currently followed by a PCP. She denies any nausea, vomiting, diaphoresis, fever, or any other associated symptoms.   The history is provided by the patient. No language interpreter was used.   Past Medical History:  Diagnosis Date  . Arthritis   . Asthma   . Back pain     There are no active problems to display for this patient.   Past Surgical History:  Procedure Laterality Date  . CESAREAN SECTION     x 4  . CHOLECYSTECTOMY      OB History    No data available     Home Medications    Prior to Admission medications   Medication Sig Start Date End Date Taking? Authorizing Provider  albuterol (PROVENTIL HFA;VENTOLIN HFA) 108 (90 Base) MCG/ACT inhaler Inhale 1-2 puffs into the lungs every 4 (four) hours as needed for wheezing or shortness of breath.    Historical Provider, MD  cyclobenzaprine (FLEXERIL) 5 MG tablet Take 1 tablet (5 mg total) by mouth 3 (three)  times daily as needed for muscle spasms. 10/26/16   Hope Orlene Och, NP  naproxen (NAPROSYN) 500 MG tablet Take 1 tablet (500 mg total) by mouth 2 (two) times daily. 10/26/16   Hope Orlene Och, NP  oxyCODONE-acetaminophen (PERCOCET/ROXICET) 5-325 MG tablet Take 1-2 tablets by mouth every 6 (six) hours as needed. 06/27/16   Honesti Neva Seat, PA-C  phenazopyridine (PYRIDIUM) 200 MG tablet Take 1 tablet (200 mg total) by mouth 3 (three) times daily. 06/27/16   Madden Neva Seat, PA-C  tamsulosin (FLOMAX) 0.4 MG CAPS capsule Take 1 capsule (0.4 mg total) by mouth daily. 06/27/16   Marlon Pel, PA-C    Family History No family history on file.  Social History Social History  Substance Use Topics  . Smoking status: Current Every Day Smoker    Types: Cigars  . Smokeless tobacco: Never Used  . Alcohol use No    Allergies   Patient has no known allergies.  Review of Systems Review of Systems  Constitutional: Positive for chills. Negative for diaphoresis and fever.  Respiratory: Positive for shortness of breath.   Cardiovascular: Positive for chest pain.  Gastrointestinal: Negative for nausea and vomiting.  All other systems reviewed and are negative.  Physical Exam Updated Vital Signs BP (!) 116/56 (BP Location: Right Arm)   Pulse 72   Temp 98.5 F (36.9 C) (Oral)   Resp (!) 22   Ht 5\' 5"  (1.651  m)   Wt 145 lb (65.8 kg)   LMP 10/27/2016 (Exact Date)   SpO2 99%   BMI 24.13 kg/m   Physical Exam  Constitutional: She is oriented to person, place, and time. She appears well-developed and well-nourished.  HENT:  Head: Normocephalic and atraumatic.  Eyes: EOM are normal. Pupils are equal, round, and reactive to light.  Neck: Normal range of motion. Neck supple. No JVD present.  Cardiovascular: Normal rate, regular rhythm and normal heart sounds.   No murmur heard. Pulmonary/Chest: Effort normal and breath sounds normal. She has no wheezes. She has no rales. She exhibits tenderness.  Moderate  anterior chest wall tenderness  Abdominal: Soft. Bowel sounds are normal. She exhibits no distension and no mass. There is no tenderness.  Musculoskeletal: Normal range of motion. She exhibits no edema.  Lymphadenopathy:    She has no cervical adenopathy.  Neurological: She is alert and oriented to person, place, and time. No cranial nerve deficit. She exhibits normal muscle tone. Coordination normal.  Skin: Skin is warm and dry. No rash noted.  Psychiatric: She has a normal mood and affect. Her behavior is normal. Judgment and thought content normal.  Nursing note and vitals reviewed.  ED Treatments / Results  DIAGNOSTIC STUDIES:  Oxygen Saturation is 99% on RA, normal by my interpretation.    COORDINATION OF CARE:  11:49 PM Discussed treatment plan with pt at bedside and pt agreed to plan.   Labs (all labs ordered are listed, but only abnormal results are displayed) Labs Reviewed  BASIC METABOLIC PANEL - Abnormal; Notable for the following:       Result Value   Glucose, Bld 100 (*)    Calcium 8.5 (*)    All other components within normal limits  CBC - Abnormal; Notable for the following:    Hemoglobin 10.5 (*)    HCT 31.1 (*)    RDW 16.0 (*)    Platelets 139 (*)    All other components within normal limits  TROPONIN I  I-STAT TROPOININ, ED    EKG  EKG Interpretation  Date/Time:  Friday November 07 2016 18:23:28 EDT Ventricular Rate:  81 PR Interval:  146 QRS Duration: 76 QT Interval:  426 QTC Calculation: 494 R Axis:   67 Text Interpretation:  Normal sinus rhythm Prolonged QT Abnormal ECG When compared with ECG of 05/22/2016, QT has lengthened Confirmed by Preston FleetingGLICK  MD, Adrean Findlay ([id number]) on 11/07/2016 11:40:23 PM       Radiology Dg Chest 2 View  Result Date: 11/07/2016 CLINICAL DATA:  Initial evaluation for acute left-sided chest pain for 3 weeks. EXAM: CHEST  2 VIEW COMPARISON:  Prior radiograph from 10/26/2016. FINDINGS: The cardiac and mediastinal silhouettes are  stable in size and contour, and remain within normal limits. The lungs are normally inflated. No airspace consolidation, pleural effusion, or pulmonary edema is identified. There is no pneumothorax. No acute osseous abnormality identified. IMPRESSION: No active cardiopulmonary disease. Electronically Signed   By: Rise MuBenjamin  McClintock M.D.   On: 11/07/2016 19:51    Procedures Procedures (including critical care time)  Medications Ordered in ED Medications  traMADol (ULTRAM) tablet 50 mg (not administered)  ketorolac (TORADOL) injection 60 mg (60 mg Intramuscular Given 11/08/16 0008)     Initial Impression / Assessment and Plan / ED Course  I have reviewed the triage vital signs and the nursing notes.  Pertinent labs & imaging results that were available during my care of the patient were reviewed by me and  considered in my medical decision making (see chart for details).  Chest pain which seems most likely to be chest wall pain. Old records are reviewed, and she was seen in the ED on March 4 for chest wall pain. Heart score equals 1 which puts her at extremely low risk for major adverse cardiovascular events in the next 30 days. She is PERC negative, sewed do not need to investigate for pulmonary embolism. She's given a dose of ketorolac and states no relief from pain, although she was noted to be resting comfortably. She is discharged with prescription for naproxen and tramadol. She is given resources to try to establish primary care physician. Encouraged to stop smoking.  Final Clinical Impressions(s) / ED Diagnoses   Final diagnoses:  None    New Prescriptions New Prescriptions   TRAMADOL (ULTRAM) 50 MG TABLET    Take 1 tablet (50 mg total) by mouth every 6 (six) hours as needed.   I personally performed the services described in this documentation, which was scribed in my presence. The recorded information has been reviewed and is accurate.       Dione Booze, MD 11/08/16 [phone]

## 2016-11-08 MED ORDER — NAPROXEN 500 MG PO TABS: 500.0000 mg | ORAL_TABLET | Freq: Two times a day (BID) | ORAL | 0 refills | Status: DC

## 2016-11-08 MED ORDER — TRAMADOL HCL 50 MG PO TABS: 50.0000 mg | ORAL_TABLET | Freq: Four times a day (QID) | ORAL | 0 refills | Status: DC | PRN

## 2016-11-08 MED ORDER — TRAMADOL HCL 50 MG PO TABS
50.0000 mg | ORAL_TABLET | Freq: Once | ORAL | Status: AC
  Administered 2016-11-08: 50 mg via ORAL
  Filled 2016-11-08: qty 1

## 2016-11-08 NOTE — ED Notes (Signed)
Explained discharge instructions to patient unable to have pt sign. Diona FoleyMorgan Brown rn

## 2016-11-22 ENCOUNTER — Encounter (HOSPITAL_COMMUNITY): Payer: Self-pay | Admitting: *Deleted

## 2016-11-22 ENCOUNTER — Emergency Department (HOSPITAL_COMMUNITY)
Admission: EM | Admit: 2016-11-22 | Discharge: 2016-11-22 | Disposition: A | Discharge status: Home / Self Care | Payer: Medicaid Other | Attending: Emergency Medicine | Admitting: Emergency Medicine

## 2016-11-22 DIAGNOSIS — Z79899 Other long term (current) drug therapy: Secondary | ICD-10-CM | POA: Diagnosis not present

## 2016-11-22 DIAGNOSIS — F17290 Nicotine dependence, other tobacco product, uncomplicated: Secondary | ICD-10-CM | POA: Diagnosis not present

## 2016-11-22 DIAGNOSIS — J45909 Unspecified asthma, uncomplicated: Secondary | ICD-10-CM | POA: Diagnosis not present

## 2016-11-22 DIAGNOSIS — L0231 Cutaneous abscess of buttock: Secondary | ICD-10-CM | POA: Insufficient documentation

## 2016-11-22 DIAGNOSIS — L0291 Cutaneous abscess, unspecified: Secondary | ICD-10-CM

## 2016-11-22 MED ORDER — DOXYCYCLINE HYCLATE 100 MG PO CAPS: 100.0000 mg | ORAL_CAPSULE | Freq: Two times a day (BID) | ORAL | 0 refills | Status: AC

## 2016-11-22 MED ORDER — LIDOCAINE-EPINEPHRINE (PF) 2 %-1:200000 IJ SOLN
10.0000 mL | Freq: Once | INTRAMUSCULAR | Status: AC
  Administered 2016-11-22: 10 mL via INTRADERMAL
  Filled 2016-11-22: qty 20

## 2016-11-22 MED ORDER — TRAMADOL HCL 50 MG PO TABS: 50.0000 mg | ORAL_TABLET | Freq: Two times a day (BID) | ORAL | 0 refills | Status: AC

## 2016-11-22 NOTE — Discharge Instructions (Signed)
Read the information below.  An abscess was opened and drained. Perform warm sitz baths frequently throughout the day. Tylenol or motrin for mild to moderate pain. Tramadol for severe pain. Keep wound clean and dry.  I have prescribed antibiotics, please take as directed.  Return in 2 days for wound recheck. Return sooner if develop fever, nausea, vomiting, joint aches.  Use the prescribed medication as directed.  Please discuss all new medications with your pharmacist.   Follow up with primary provider for further evaluation and management.  You may return to the Emergency Department at any time for worsening condition or any new symptoms that concern you.

## 2016-11-22 NOTE — ED Notes (Signed)
Pt stable, understands discharge instructions, and reasons for return.   

## 2016-11-22 NOTE — ED Triage Notes (Signed)
Pt reports having an abscess on her buttock x 10 years that intermittently gets more painful. No acute distress is noted at triage.

## 2016-11-22 NOTE — ED Notes (Signed)
Pt c/o abscess to right upper buttock. Purulent drainage noted.

## 2016-11-22 NOTE — ED Provider Notes (Signed)
[facility]PT Provider Note    By signing my name below, I, Earmon Phoenix, attest that this documentation has been prepared under the direction and in the presence of Arvilla Meres, PA-C. Electronically Signed: Earmon Phoenix, ED Scribe. 11/22/16. 3:03 PM.    History   Chief Complaint Chief Complaint  Patient presents with  . Abscess    The history is provided by the patient and medical records. No language interpreter was used.    Norma Moreno is a 35 y.o. female who presents to the Emergency Department complaining of an abscess to her right buttock that appeared about ten years ago. She reports associated swelling. She states the abscess intermittently becomes painful and states this flare up began 2-3 days ago. She has not taken anything for pain relief. Touching the area or sitting increases the pain. She denies alleviating factors. She denies fever, chills, abdominal pain, nausea, vomiting, dysuria, hematuria, or being immunocompromise in any way.   Past Medical History:  Diagnosis Date  . Arthritis   . Asthma   . Back pain     There are no active problems to display for this patient.   Past Surgical History:  Procedure Laterality Date  . CESAREAN SECTION     x 4  . CHOLECYSTECTOMY      OB History    No data available       Home Medications    Prior to Admission medications   Medication Sig Start Date End Date Taking? Authorizing Provider  albuterol (PROVENTIL HFA;VENTOLIN HFA) 108 (90 Base) MCG/ACT inhaler Inhale 1-2 puffs into the lungs every 4 (four) hours as needed for wheezing or shortness of breath.    Historical Provider, MD  cyclobenzaprine (FLEXERIL) 5 MG tablet Take 1 tablet (5 mg total) by mouth 3 (three) times daily as needed for muscle spasms. 10/26/16   Hope Orlene Och, NP  doxycycline (VIBRAMYCIN) 100 MG capsule Take 1 capsule (100 mg total) by mouth 2 (two) times daily. 11/22/16 11/29/16  Deborha Payment, PA-C  naproxen (NAPROSYN) 500 MG  tablet Take 1 tablet (500 mg total) by mouth 2 (two) times daily. 11/08/16   Dione Booze, MD  oxyCODONE-acetaminophen (PERCOCET/ROXICET) 5-325 MG tablet Take 1-2 tablets by mouth every 6 (six) hours as needed. 06/27/16   Damara Neva Seat, PA-C  phenazopyridine (PYRIDIUM) 200 MG tablet Take 1 tablet (200 mg total) by mouth 3 (three) times daily. 06/27/16   Cassadi Neva Seat, PA-C  tamsulosin (FLOMAX) 0.4 MG CAPS capsule Take 1 capsule (0.4 mg total) by mouth daily. 06/27/16   Yoseline Neva Seat, PA-C  traMADol (ULTRAM) 50 MG tablet Take 1 tablet (50 mg total) by mouth 2 (two) times daily. 11/22/16 11/29/16  Deborha Payment, PA-C    Family History History reviewed. No pertinent family history.  Social History Social History  Substance Use Topics  . Smoking status: Current Every Day Smoker    Types: Cigars  . Smokeless tobacco: Never Used  . Alcohol use No     Allergies   Patient has no known allergies.   Review of Systems Review of Systems  Constitutional: Negative for chills and fever.  Gastrointestinal: Negative for abdominal pain, nausea and vomiting.  Genitourinary: Negative for dysuria and hematuria.  Musculoskeletal: Negative for arthralgias and myalgias.  Skin: Positive for color change (abscess to right buttock).  Allergic/Immunologic: Negative for immunocompromised state.     Physical Exam Updated Vital Signs BP 119/74 (BP Location: Right Arm)   Pulse 78   Temp 98.3 F (  36.8 C) (Oral)   Resp 18   LMP 11/22/2016   SpO2 99%   Physical Exam  Constitutional: She is oriented to person, place, and time. She appears well-developed and well-nourished.  HENT:  Head: Normocephalic and atraumatic.  Neck: Normal range of motion.  Cardiovascular: Normal rate.   Pulmonary/Chest: Effort normal.  Musculoskeletal: Normal range of motion.  Neurological: She is alert and oriented to person, place, and time.  Skin: Skin is warm and dry.  Small purulent region with surrounding erythema and  tenderness to palpation at superior right gluteal cleft. Scant purulent drainage.   Psychiatric: She has a normal mood and affect. Her behavior is normal.  Nursing note and vitals reviewed.    ED Treatments / Results   DIAGNOSTIC STUDIES: Oxygen Saturation is 99% on RA, normal by my interpretation.   COORDINATION OF CARE: 2:23 PM- Will do bedside ultrasound prior to I & D. Pt verbalizes understanding and agrees to plan.  Medications  lidocaine-EPINEPHrine (XYLOCAINE W/EPI) 2 %-1:200000 (PF) injection 10 mL (10 mLs Intradermal Given 11/22/16 1427)   Labs (all labs ordered are listed, but only abnormal results are displayed) Labs Reviewed - No data to display  EKG  EKG Interpretation None       Radiology No results found.  Procedures [place] bedside Date/Time: 11/22/2016 2:38 PM Performed by: Deborha Payment Authorized by: Deborha Payment  Consent: Verbal consent obtained. Consent given by: patient Patient understanding: patient states understanding of the procedure being performed Patient consent: the patient's understanding of the procedure matches consent given Procedure consent: procedure consent matches procedure scheduled Site marked: the operative site was marked Required items: required blood products, implants, devices, and special equipment available Patient identity confirmed: verbally with patient Local anesthesia used: no  Anesthesia: Local anesthesia used: no  Sedation: Patient sedated: no Patient tolerance: Patient tolerated the procedure well with no immediate complications Comments: No color flow at site of abscess.  Marland Kitchen.Incision and Drainage Date/Time: 11/22/2016 2:45 PM Performed by: Deborha Payment Authorized by: Deborha Payment   Consent:    Consent obtained:  Verbal   Consent given by:  Patient Location:    Type:  Abscess   Size:  1 cm   Location:  Anogenital   Anogenital location:  Pilonidal Pre-procedure details:    Skin preparation:   Betadine Anesthesia (see MAR for exact dosages):    Anesthesia method:  Local infiltration   Local anesthetic:  Lidocaine 2% WITH epi Procedure type:    Complexity:  Simple Procedure details:    Needle aspiration: no     Incision types:  Single straight   Incision depth:  Subcutaneous   Scalpel blade:  11   Wound management:  Irrigated with saline   Drainage:  Bloody and purulent   Drainage amount:  Scant   Wound treatment:  Wound left open   Packing materials:  None Post-procedure details:    Patient tolerance of procedure:  Tolerated well, no immediate complications   (including critical care time)  Medications Ordered in ED Medications  lidocaine-EPINEPHrine (XYLOCAINE W/EPI) 2 %-1:200000 (PF) injection 10 mL (10 mLs Intradermal Given 11/22/16 1427)     Initial Impression / Assessment and Plan / ED Course  I have reviewed the triage vital signs and the nursing notes.  Pertinent labs & imaging results that were available during my care of the patient were reviewed by me and considered in my medical decision making (see chart for details).     Patient  presents to ED with complaint of abscess to right buttock. Patient is afebrile and non-toxic appearing in NAD. VSS.  Patient with skin abscess amenable to incision and drainage.  Abscess was not large enough to warrant packing or drain,  wound recheck in 2 days. Encouraged home warm soaks and flushing.  Mild signs of cellulitis is surrounding skin. ABX prescribed. Will d/c to home. Tylenol, motrin, tramadol for pain relief. Review of [place] controlled substance database shows last narcotic rx 11/17 - oxycodone-acetaminophen 20 tabs. Return precautions given. Pt voiced understanding and is agreeable.    I personally performed the services described in this documentation, which was scribed in my presence. The recorded information has been reviewed and is accurate.   Final Clinical Impressions(s) / ED Diagnoses   Final diagnoses:    Abscess    New Prescriptions New Prescriptions   DOXYCYCLINE (VIBRAMYCIN) 100 MG CAPSULE    Take 1 capsule (100 mg total) by mouth 2 (two) times daily.   TRAMADOL (ULTRAM) 50 MG TABLET    Take 1 tablet (50 mg total) by mouth 2 (two) times daily.     Deborha Payment, PA-C 11/22/16 1517    Deborha Payment, PA-C 11/22/16 1648    Maia Plan, MD 11/23/16 [phone]

## 2017-02-28 ENCOUNTER — Encounter (HOSPITAL_COMMUNITY): Payer: Self-pay | Admitting: Emergency Medicine

## 2017-02-28 ENCOUNTER — Emergency Department (HOSPITAL_COMMUNITY)
Admission: EM | Admit: 2017-02-28 | Discharge: 2017-02-28 | Disposition: A | Discharge status: Home / Self Care | Payer: Medicaid Other | Attending: Emergency Medicine | Admitting: Emergency Medicine

## 2017-02-28 DIAGNOSIS — R1084 Generalized abdominal pain: Secondary | ICD-10-CM | POA: Insufficient documentation

## 2017-02-28 DIAGNOSIS — J45909 Unspecified asthma, uncomplicated: Secondary | ICD-10-CM | POA: Insufficient documentation

## 2017-02-28 DIAGNOSIS — F17290 Nicotine dependence, other tobacco product, uncomplicated: Secondary | ICD-10-CM | POA: Insufficient documentation

## 2017-02-28 LAB — COMPREHENSIVE METABOLIC PANEL
ALK PHOS: 52 U/L (ref 38–126)
ALT: 12 U/L — ABNORMAL LOW (ref 14–54)
ANION GAP: 7 (ref 5–15)
AST: 21 U/L (ref 15–41)
Albumin: 3.7 g/dL (ref 3.5–5.0)
BILIRUBIN TOTAL: 0.5 mg/dL (ref 0.3–1.2)
BUN: 5 mg/dL — ABNORMAL LOW (ref 6–20)
CALCIUM: 8.7 mg/dL — AB (ref 8.9–10.3)
CO2: 23 mmol/L (ref 22–32)
Chloride: 105 mmol/L (ref 101–111)
Creatinine, Ser: 0.98 mg/dL (ref 0.44–1.00)
Glucose, Bld: 84 mg/dL (ref 65–99)
POTASSIUM: 3.2 mmol/L — AB (ref 3.5–5.1)
Sodium: 135 mmol/L (ref 135–145)
TOTAL PROTEIN: 6.8 g/dL (ref 6.5–8.1)

## 2017-02-28 LAB — CBC
HEMATOCRIT: 32.1 % — AB (ref 36.0–46.0)
HEMOGLOBIN: 10.8 g/dL — AB (ref 12.0–15.0)
MCH: 27.1 pg (ref 26.0–34.0)
MCHC: 33.6 g/dL (ref 30.0–36.0)
MCV: 80.5 fL (ref 78.0–100.0)
Platelets: 151 10*3/uL (ref 150–400)
RBC: 3.99 MIL/uL (ref 3.87–5.11)
RDW: 15.7 % — ABNORMAL HIGH (ref 11.5–15.5)
WBC: 6.4 10*3/uL (ref 4.0–10.5)

## 2017-02-28 LAB — LIPASE, BLOOD: Lipase: 28 U/L (ref 11–51)

## 2017-02-28 LAB — URINALYSIS, ROUTINE W REFLEX MICROSCOPIC
BILIRUBIN URINE: NEGATIVE
Glucose, UA: NEGATIVE mg/dL
Hgb urine dipstick: NEGATIVE
Ketones, ur: NEGATIVE mg/dL
LEUKOCYTES UA: NEGATIVE
NITRITE: NEGATIVE
Protein, ur: NEGATIVE mg/dL
Specific Gravity, Urine: 1.006 (ref 1.005–1.030)
pH: 7 (ref 5.0–8.0)

## 2017-02-28 LAB — I-STAT BETA HCG BLOOD, ED (MC, WL, AP ONLY)

## 2017-02-28 MED ORDER — ACETAMINOPHEN 325 MG PO TABS
650.0000 mg | ORAL_TABLET | Freq: Once | ORAL | Status: AC
  Administered 2017-02-28: 650 mg via ORAL
  Filled 2017-02-28: qty 2

## 2017-02-28 MED ORDER — IBUPROFEN 600 MG PO TABS: 600.0000 mg | ORAL_TABLET | Freq: Three times a day (TID) | ORAL | 0 refills | Status: DC | PRN

## 2017-02-28 MED ORDER — ONDANSETRON 4 MG PO TBDP
8.0000 mg | ORAL_TABLET | Freq: Once | ORAL | Status: AC
  Administered 2017-02-28: 8 mg via ORAL
  Filled 2017-02-28: qty 2

## 2017-02-28 NOTE — Discharge Instructions (Signed)

## 2017-02-28 NOTE — ED Notes (Signed)
Pt also states some urinary frequency and states hx of kidney stones.

## 2017-02-28 NOTE — ED Notes (Signed)
Pt understood dc material. NAD noted. Script given at dc  

## 2017-02-28 NOTE — ED Triage Notes (Signed)
Pt presents C/O gen abd pain, N/V since 7/3. Pt appears to be in discomfort in triage.

## 2017-02-28 NOTE — ED Provider Notes (Signed)
[facility] Provider Note   CSN: [id number] Arrival date & time: 02/28/17  0120 By signing my name below, I, Levon Hedger, attest that this documentation has been prepared under the direction and in the presence of Zadie Rhine, MD . Electronically Signed: Levon Hedger, Scribe. 02/28/2017. 2:04 AM.   History   Chief Complaint Chief Complaint  Patient presents with  . Abdominal Pain   HPI Norma Moreno is a 36 y.o. female who presents to the Emergency Department complaining of diffuse abdominal pain onset four days ago. She reports associated nausea, vomiting, and headache. No alleviating or modifying factors noted.  No prior treatments indicated. Abdominal Shx includes cholecystectomy. LNMP 02/16/17. Pt denies any diarrhea, constipation, dysuria, hematuria, vaginal bleeding or discharge, or chest pain.  Pt has no other acute complaints or associated symptoms at this time.   The history is provided by the patient. No language interpreter was used.  Abdominal Pain   This is a new problem. The current episode started more than 2 days ago. The problem occurs constantly. The problem has not changed since onset.The pain is associated with an unknown factor. The pain is located in the generalized abdominal region. The pain is at a severity of 10/10. Associated symptoms include nausea, vomiting and headaches. Pertinent negatives include diarrhea, constipation, dysuria and hematuria. Nothing aggravates the symptoms. Nothing relieves the symptoms. Her past medical history is significant for gallstones.   Past Medical History:  Diagnosis Date  . Arthritis   . Asthma   . Back pain     There are no active problems to display for this patient.   Past Surgical History:  Procedure Laterality Date  . CESAREAN SECTION     x 4  . CHOLECYSTECTOMY      OB History    No data available       Home Medications    Prior to Admission medications   Medication Sig Start Date End Date  Taking? Authorizing Provider  ibuprofen (ADVIL,MOTRIN) 600 MG tablet Take 1 tablet (600 mg total) by mouth every 8 (eight) hours as needed for moderate pain. 02/28/17   Zadie Rhine, MD    Family History No family history on file.  Social History Social History  Substance Use Topics  . Smoking status: Current Every Day Smoker    Types: Cigars  . Smokeless tobacco: Never Used  . Alcohol use No    Allergies   Patient has no known allergies.   Review of Systems Review of Systems  Cardiovascular: Negative for chest pain.  Gastrointestinal: Positive for abdominal pain, nausea and vomiting. Negative for constipation and diarrhea.  Genitourinary: Negative for dysuria, hematuria, vaginal bleeding and vaginal discharge.  Neurological: Positive for headaches.  All other systems reviewed and are negative.  Physical Exam Updated Vital Signs BP 104/62   Pulse 69   Temp 98.1 F (36.7 C) (Oral)   Resp 18   Ht 5\' 4"  (1.626 m)   Wt 155 lb (70.3 kg)   LMP 02/16/2017 (Exact Date)   SpO2 100%   BMI 26.61 kg/m   Physical Exam  Nursing note and vitals reviewed. CONSTITUTIONAL: Well developed/well nourished HEAD: Normocephalic/atraumatic EYES: EOMI/PERRL ENMT: Mucous membranes moist NECK: supple no meningeal signs SPINE/BACK:entire spine nontender CV: S1/S2 noted, no murmurs/rubs/gallops noted LUNGS: Lungs are clear to auscultation bilaterally, no apparent distress ABDOMEN: soft,  Mild diffuse tenderness, no rebound or guarding, bowel sounds noted throughout abdomen GU:no cva tenderness NEURO: Pt is awake/alert/appropriate, moves all extremitiesx4.  No facial  droop.   EXTREMITIES: pulses normal/equal, full ROM SKIN: warm, color normal PSYCH: no abnormalities of mood noted, alert and oriented to situation   ED Treatments / Results  DIAGNOSTIC STUDIES:  Oxygen Saturation is 98% on RA, normal by my interpretation.    COORDINATION OF CARE:  2:02 AM Discussed treatment plan  with pt at bedside and pt agreed to plan.   Labs (all labs ordered are listed, but only abnormal results are displayed) Labs Reviewed  COMPREHENSIVE METABOLIC PANEL - Abnormal; Notable for the following:       Result Value   Potassium 3.2 (*)    BUN 5 (*)    Calcium 8.7 (*)    ALT 12 (*)    All other components within normal limits  CBC - Abnormal; Notable for the following:    Hemoglobin 10.8 (*)    HCT 32.1 (*)    RDW 15.7 (*)    All other components within normal limits  LIPASE, BLOOD  URINALYSIS, ROUTINE W REFLEX MICROSCOPIC  I-STAT BETA HCG BLOOD, ED ([facility], [facility], [facility] ONLY)    EKG  EKG Interpretation None       Radiology No results found.  Procedures Procedures    Medications Ordered in ED Medications  ondansetron (ZOFRAN-ODT) disintegrating tablet 8 mg (8 mg Oral Given 02/28/17 0223)  acetaminophen (TYLENOL) tablet 650 mg (650 mg Oral Given 02/28/17 0224)     Initial Impression / Assessment and Plan / ED Course  I have reviewed the triage vital signs and the nursing notes.  Pertinent labs  results that were available during my care of the patient were reviewed by me and considered in my medical decision making (see chart for details).     Pt stable She reports this feels like previous kidney stone On reassessment she is sleeping, no distress No focal ABD tenderness Labs reassuring My suspicion for acute abdominal/GYN emergency is low  Will d/c home We discussed strict ER return precautions   Final Clinical Impressions(s) / ED Diagnoses   Final diagnoses:  Generalized abdominal pain    New Prescriptions Discharge Medication List as of 02/28/2017  3:31 AM    START taking these medications   Details  ibuprofen (ADVIL,MOTRIN) 600 MG tablet Take 1 tablet (600 mg total) by mouth every 8 (eight) hours as needed for moderate pain., Starting Sat 02/28/2017, Print       I personally performed the services described in this documentation, which was scribed in  my presence. The recorded information has been reviewed and is accurate.        Zadie RhineWickline, Sevastian Witczak, MD 02/28/17 [phone]

## 2017-03-02 ENCOUNTER — Encounter (HOSPITAL_COMMUNITY): Payer: Self-pay

## 2017-03-02 DIAGNOSIS — N3001 Acute cystitis with hematuria: Secondary | ICD-10-CM | POA: Insufficient documentation

## 2017-03-02 DIAGNOSIS — R1030 Lower abdominal pain, unspecified: Secondary | ICD-10-CM | POA: Diagnosis not present

## 2017-03-02 DIAGNOSIS — J45909 Unspecified asthma, uncomplicated: Secondary | ICD-10-CM | POA: Diagnosis not present

## 2017-03-02 DIAGNOSIS — F17290 Nicotine dependence, other tobacco product, uncomplicated: Secondary | ICD-10-CM | POA: Diagnosis not present

## 2017-03-02 DIAGNOSIS — R300 Dysuria: Secondary | ICD-10-CM | POA: Diagnosis present

## 2017-03-02 LAB — URINALYSIS, ROUTINE W REFLEX MICROSCOPIC
BACTERIA UA: NONE SEEN
BILIRUBIN URINE: NEGATIVE
Glucose, UA: NEGATIVE mg/dL
KETONES UR: NEGATIVE mg/dL
NITRITE: NEGATIVE
PROTEIN: 100 mg/dL — AB
Specific Gravity, Urine: 1.005 (ref 1.005–1.030)
pH: 8 (ref 5.0–8.0)

## 2017-03-02 LAB — PREGNANCY, URINE: PREG TEST UR: NEGATIVE

## 2017-03-02 NOTE — ED Triage Notes (Addendum)
Pt endorses abd pain, back pain,hematuria, and frequent urination. Pt was seen here 2 days ago for same and d/c home. Pt has hx of kidney stones. VSS.

## 2017-03-03 ENCOUNTER — Emergency Department (HOSPITAL_COMMUNITY)
Admission: EM | Admit: 2017-03-03 | Discharge: 2017-03-03 | Disposition: A | Discharge status: Home / Self Care | Payer: Medicaid Other | Attending: Emergency Medicine | Admitting: Emergency Medicine

## 2017-03-03 DIAGNOSIS — N3001 Acute cystitis with hematuria: Secondary | ICD-10-CM

## 2017-03-03 MED ORDER — CEPHALEXIN 500 MG PO CAPS: 500.0000 mg | ORAL_CAPSULE | Freq: Two times a day (BID) | ORAL | 0 refills | Status: DC

## 2017-03-03 MED ORDER — PHENAZOPYRIDINE HCL 95 MG PO TABS: 95.0000 mg | ORAL_TABLET | Freq: Three times a day (TID) | ORAL | 0 refills | Status: DC | PRN

## 2017-03-03 MED ORDER — HYDROCODONE-ACETAMINOPHEN 5-325 MG PO TABS
1.0000 | ORAL_TABLET | Freq: Once | ORAL | Status: AC
  Administered 2017-03-03: 1 via ORAL
  Filled 2017-03-03: qty 1

## 2017-03-03 MED ORDER — CEPHALEXIN 250 MG PO CAPS
500.0000 mg | ORAL_CAPSULE | Freq: Once | ORAL | Status: AC
  Administered 2017-03-03: 500 mg via ORAL
  Filled 2017-03-03: qty 2

## 2017-03-03 NOTE — ED Provider Notes (Signed)
[facility] Provider Note   CSN: [id number] Arrival date & time: 03/02/17  2141   By signing my name below, I, [name], attest that this documentation has been prepared under the direction and in the presence of Roxy Horseman, PA-C. Electronically signed, [name], ED Scribe. 03/03/17. 3:29 AM.  History   Chief Complaint Chief Complaint  Patient presents with  . Dysuria  . Abdominal Pain  . Hematuria   The history is provided by the patient and medical records. No language interpreter was used.    Norma Moreno is a 36 y.o. female with h/o kidney stones presenting to the Emergency Department concerning persistent suprapubic pain x > 2 days. Associated dysuria, chills, hematuria, urinary frequency, urgency. Pt evaluated for similar symptoms 2 days ago, labs NL at the time and pt discharged home with return precautions. Gallbladder removed. No known medication allergies. No vomiting or fever.  Past Medical History:  Diagnosis Date  . Arthritis   . Asthma   . Back pain     There are no active problems to display for this patient.   Past Surgical History:  Procedure Laterality Date  . CESAREAN SECTION     x 4  . CHOLECYSTECTOMY      OB History    No data available       Home Medications    Prior to Admission medications   Medication Sig Start Date End Date Taking? Authorizing Provider  ibuprofen (ADVIL,MOTRIN) 600 MG tablet Take 1 tablet (600 mg total) by mouth every 8 (eight) hours as needed for moderate pain. 02/28/17   Zadie Rhine, MD    Family History History reviewed. No pertinent family history.  Social History Social History  Substance Use Topics  . Smoking status: Current Every Day Smoker    Types: Cigars  . Smokeless tobacco: Never Used  . Alcohol use No     Allergies   Patient has no known allergies.   Review of Systems Review of Systems  Constitutional: Positive for chills. Negative for fever.  Gastrointestinal:  Positive for abdominal pain. Negative for vomiting.  Genitourinary: Positive for dysuria, frequency, hematuria and urgency.  All other systems reviewed and are negative.    Physical Exam Updated Vital Signs BP 130/77 (BP Location: Left Arm)   Pulse 72   Temp 98.9 F (37.2 C) (Oral)   Resp 16   Ht 5\' 4"  (1.626 m)   Wt 154 lb (69.9 kg)   LMP 02/16/2017 (Exact Date)   SpO2 99%   BMI 26.43 kg/m   Physical Exam  Constitutional: She is oriented to person, place, and time. She appears well-developed and well-nourished. No distress.  HENT:  Head: Normocephalic and atraumatic.  Eyes: EOM are normal.  Neck: Normal range of motion.  Cardiovascular: Normal rate, regular rhythm and normal heart sounds.   Pulmonary/Chest: Effort normal and breath sounds normal.  Abdominal: Soft. She exhibits no distension. There is tenderness (TTP) in the suprapubic area.  Musculoskeletal: Normal range of motion.  Neurological: She is alert and oriented to person, place, and time.  Skin: Skin is warm and dry.  Psychiatric: She has a normal mood and affect. Judgment normal.  Nursing note and vitals reviewed.    ED Treatments / Results  DIAGNOSTIC STUDIES: Oxygen Saturation is 99% on RA, NL by my interpretation.    COORDINATION OF CARE: 3:20 AM-Discussed next steps with pt. Pt verbalized understanding and is agreeable with the plan. Will Order and Rx medications.   Labs (  all labs ordered are listed, but only abnormal results are displayed) Labs Reviewed  URINALYSIS, ROUTINE W REFLEX MICROSCOPIC - Abnormal; Notable for the following:       Result Value   APPearance CLOUDY (*)    Hgb urine dipstick LARGE (*)    Protein, ur 100 (*)    Leukocytes, UA LARGE (*)    Squamous Epithelial / LPF 0-5 (*)    All other components within normal limits  PREGNANCY, URINE    EKG  EKG Interpretation None       Radiology No results found.  Procedures Procedures (including critical care  time)  Medications Ordered in ED Medications - No data to display   Initial Impression / Assessment and Plan / ED Course  I have reviewed the triage vital signs and the nursing notes.  Pertinent labs & imaging results that were available during my care of the patient were reviewed by me and considered in my medical decision making (see chart for details).    Patient was suprapubic abdominal pain with associated dysuria, and hematuria. Urine pregnancy test negative. Urinalysis is consistent with UTI. Will treat with Keflex. Patient advised to return to the emergency department if her symptoms worsen. She does not have focal McBurney point tenderness, I doubt appendicitis in this patient. She does report a history of kidney stones, however her presentation is inconsistent with someone suffering from renal colic. I believe that her symptoms are most consistent with UTI. Return cautions given.   Final Clinical Impressions(s) / ED Diagnoses   Final diagnoses:  Acute cystitis with hematuria    New Prescriptions New Prescriptions   No medications on file  I personally performed the services described in this documentation, which was scribed in my presence. The recorded information has been reviewed and is accurate.      Roxy HorsemanBrowning, [place] Strieter, PA-C 03/03/17 [id number]    Derwood KaplanNanavati, Ankit, MD 03/04/17 0030

## 2017-03-05 LAB — URINE CULTURE

## 2017-03-06 ENCOUNTER — Telehealth: Payer: Self-pay

## 2017-03-06 NOTE — Telephone Encounter (Signed)
Post ED Visit - Positive Culture Follow-up  Culture report reviewed by antimicrobial stewardship pharmacist:  []  Enzo BiNathan Batchelder, Pharm.D. []  Celedonio MiyamotoJeremy Frens, Pharm.D., BCPS AQ-ID []  Garvin FilaMike Maccia, Pharm.D., BCPS [x]  Georgina PillionElizabeth Martin, Pharm.D., BCPS []  MemphisMinh Pham, [address] Rainbow BoulevardPharm.D., BCPS, AAHIVP []  Estella HuskMichelle Turner, Pharm.D., BCPS, AAHIVP []  Lysle Pearlachel Rumbarger, PharmD, BCPS []  Casilda Carlsaylor Stone, PharmD, BCPS []  Pollyann SamplesAndy Johnston, PharmD, BCPS  Positive urine culture Treated with Cephalexin, organism sensitive to the same and no further patient follow-up is required at this time.  Jerry CarasCullom, Holton Sidman Burnett 03/06/2017, 9:18 AM

## 2017-03-07 ENCOUNTER — Encounter (HOSPITAL_COMMUNITY): Payer: Self-pay | Admitting: Emergency Medicine

## 2017-03-07 ENCOUNTER — Inpatient Hospital Stay (HOSPITAL_COMMUNITY)
Admission: EM | Admit: 2017-03-07 | Discharge: 2017-03-10 | DRG: 690 | Disposition: A | Discharge status: Home / Self Care | Payer: Medicaid Other | Attending: Family Medicine | Admitting: Family Medicine

## 2017-03-07 ENCOUNTER — Emergency Department (HOSPITAL_COMMUNITY): Payer: Medicaid Other

## 2017-03-07 DIAGNOSIS — F17290 Nicotine dependence, other tobacco product, uncomplicated: Secondary | ICD-10-CM | POA: Diagnosis present

## 2017-03-07 DIAGNOSIS — G43909 Migraine, unspecified, not intractable, without status migrainosus: Secondary | ICD-10-CM | POA: Diagnosis present

## 2017-03-07 DIAGNOSIS — E860 Dehydration: Secondary | ICD-10-CM | POA: Diagnosis present

## 2017-03-07 DIAGNOSIS — Z720 Tobacco use: Secondary | ICD-10-CM

## 2017-03-07 DIAGNOSIS — D696 Thrombocytopenia, unspecified: Secondary | ICD-10-CM | POA: Diagnosis present

## 2017-03-07 DIAGNOSIS — D6959 Other secondary thrombocytopenia: Secondary | ICD-10-CM | POA: Diagnosis present

## 2017-03-07 DIAGNOSIS — N179 Acute kidney failure, unspecified: Secondary | ICD-10-CM | POA: Diagnosis present

## 2017-03-07 DIAGNOSIS — N12 Tubulo-interstitial nephritis, not specified as acute or chronic: Secondary | ICD-10-CM | POA: Diagnosis present

## 2017-03-07 DIAGNOSIS — Z87440 Personal history of urinary (tract) infections: Secondary | ICD-10-CM

## 2017-03-07 DIAGNOSIS — D649 Anemia, unspecified: Secondary | ICD-10-CM | POA: Diagnosis not present

## 2017-03-07 DIAGNOSIS — R109 Unspecified abdominal pain: Secondary | ICD-10-CM | POA: Diagnosis present

## 2017-03-07 DIAGNOSIS — K529 Noninfective gastroenteritis and colitis, unspecified: Secondary | ICD-10-CM | POA: Diagnosis present

## 2017-03-07 DIAGNOSIS — J45909 Unspecified asthma, uncomplicated: Secondary | ICD-10-CM

## 2017-03-07 DIAGNOSIS — R197 Diarrhea, unspecified: Secondary | ICD-10-CM | POA: Diagnosis present

## 2017-03-07 DIAGNOSIS — N10 Acute tubulo-interstitial nephritis: Secondary | ICD-10-CM | POA: Diagnosis present

## 2017-03-07 HISTORY — DX: Tobacco use: Z72.0

## 2017-03-07 LAB — URINALYSIS, ROUTINE W REFLEX MICROSCOPIC
BILIRUBIN URINE: NEGATIVE
GLUCOSE, UA: NEGATIVE mg/dL
KETONES UR: NEGATIVE mg/dL
NITRITE: POSITIVE — AB
PH: 5 (ref 5.0–8.0)
Protein, ur: 100 mg/dL — AB
SPECIFIC GRAVITY, URINE: 1.014 (ref 1.005–1.030)

## 2017-03-07 LAB — CBC WITH DIFFERENTIAL/PLATELET
BASOS ABS: 0 10*3/uL (ref 0.0–0.1)
BASOS PCT: 0 %
Eosinophils Absolute: 0.2 10*3/uL (ref 0.0–0.7)
Eosinophils Relative: 3 %
HEMATOCRIT: 35.6 % — AB (ref 36.0–46.0)
HEMOGLOBIN: 12.6 g/dL (ref 12.0–15.0)
LYMPHS PCT: 15 %
Lymphs Abs: 1.2 10*3/uL (ref 0.7–4.0)
MCH: 27.9 pg (ref 26.0–34.0)
MCHC: 35.4 g/dL (ref 30.0–36.0)
MCV: 78.9 fL (ref 78.0–100.0)
MONO ABS: 1.1 10*3/uL — AB (ref 0.1–1.0)
MONOS PCT: 13 %
NEUTROS ABS: 6 10*3/uL (ref 1.7–7.7)
NEUTROS PCT: 70 %
Platelets: 120 10*3/uL — ABNORMAL LOW (ref 150–400)
RBC: 4.51 MIL/uL (ref 3.87–5.11)
RDW: 15.9 % — ABNORMAL HIGH (ref 11.5–15.5)
WBC: 8.6 10*3/uL (ref 4.0–10.5)

## 2017-03-07 LAB — BASIC METABOLIC PANEL
ANION GAP: 10 (ref 5–15)
BUN: 16 mg/dL (ref 6–20)
CHLORIDE: 110 mmol/L (ref 101–111)
CO2: 20 mmol/L — AB (ref 22–32)
Calcium: 9.1 mg/dL (ref 8.9–10.3)
Creatinine, Ser: 1.59 mg/dL — ABNORMAL HIGH (ref 0.44–1.00)
GFR calc non Af Amer: 41 mL/min — ABNORMAL LOW (ref >60)
GFR, EST AFRICAN AMERICAN: 48 mL/min — AB (ref >60)
GLUCOSE: 95 mg/dL (ref 65–99)
Potassium: 3.7 mmol/L (ref 3.5–5.1)
Sodium: 140 mmol/L (ref 135–145)

## 2017-03-07 LAB — PREGNANCY, URINE: Preg Test, Ur: NEGATIVE

## 2017-03-07 MED ORDER — ONDANSETRON HCL 4 MG/2ML IJ SOLN
4.0000 mg | Freq: Once | INTRAMUSCULAR | Status: AC
  Administered 2017-03-07: 4 mg via INTRAVENOUS
  Filled 2017-03-07: qty 2

## 2017-03-07 MED ORDER — SODIUM CHLORIDE 0.9 % IV SOLN
INTRAVENOUS | Status: DC
  Administered 2017-03-08 (×3): via INTRAVENOUS
  Administered 2017-03-09: 1000 mL via INTRAVENOUS
  Administered 2017-03-09: 09:00:00 via INTRAVENOUS

## 2017-03-07 MED ORDER — SODIUM CHLORIDE 0.9 % IV BOLUS (SEPSIS)
2000.0000 mL | Freq: Once | INTRAVENOUS | Status: AC
Start: 2017-03-07 — End: 2017-03-07
  Administered 2017-03-07: 2000 mL via INTRAVENOUS

## 2017-03-07 MED ORDER — DEXTROSE 5 % IV SOLN
1.0000 g | INTRAVENOUS | Status: DC
  Administered 2017-03-07: 1 g via INTRAVENOUS
  Filled 2017-03-07 (×2): qty 10

## 2017-03-07 MED ORDER — SODIUM CHLORIDE 0.9 % IV BOLUS (SEPSIS)
1000.0000 mL | Freq: Once | INTRAVENOUS | Status: AC
  Administered 2017-03-07: 1000 mL via INTRAVENOUS

## 2017-03-07 MED ORDER — MORPHINE SULFATE (PF) 4 MG/ML IV SOLN
4.0000 mg | Freq: Once | INTRAVENOUS | Status: AC
  Administered 2017-03-07: 4 mg via INTRAVENOUS
  Filled 2017-03-07: qty 1

## 2017-03-07 NOTE — ED Triage Notes (Addendum)
Pt brought in by [facility] with c/o abd pain and left side flank pain  Pt was recently diagnosed with a UTI and was given Keflex but pt states she has not taken it all yet  Pt adds that she has nausea and diarrhea

## 2017-03-07 NOTE — ED Provider Notes (Signed)
[facility]-EMERGENCY DEPT Provider Note   CSN: [id number] Arrival date & time: 03/07/17  1916     History   Chief Complaint Chief Complaint  Patient presents with  . Abdominal Pain    HPI Norma Moreno is a 36 y.o. female.  36 year old female presents with persistent bilateral flank pain with nausea and vomiting. She had vomiting yesterday but since subsided. Denies any dysuria or hematuria. Recently treated for UTI with Keflex and states that she has been compliant. Denies any vaginal bleeding or discharge. Has had some mild diarrhea. Symptoms have been persistent nothing makes them better.      Past Medical History:  Diagnosis Date  . Arthritis   . Asthma   . Back pain     There are no active problems to display for this patient.   Past Surgical History:  Procedure Laterality Date  . CESAREAN SECTION     x 4  . CHOLECYSTECTOMY      OB History    No data available       Home Medications    Prior to Admission medications   Medication Sig Start Date End Date Taking? Authorizing Provider  cephALEXin (KEFLEX) 500 MG capsule Take 1 capsule (500 mg total) by mouth 2 (two) times daily. 03/03/17   Roxy HorsemanBrowning, Robert, PA-C  ibuprofen (ADVIL,MOTRIN) 600 MG tablet Take 1 tablet (600 mg total) by mouth every 8 (eight) hours as needed for moderate pain. 02/28/17   Zadie RhineWickline, Donald, MD  phenazopyridine (PYRIDIUM) 95 MG tablet Take 1 tablet (95 mg total) by mouth 3 (three) times daily as needed for pain. 03/03/17   Roxy HorsemanBrowning, Robert, PA-C    Family History History reviewed. No pertinent family history.  Social History Social History  Substance Use Topics  . Smoking status: Current Every Day Smoker    Types: Cigars  . Smokeless tobacco: Never Used  . Alcohol use No     Allergies   Patient has no known allergies.   Review of Systems Review of Systems  All other systems reviewed and are negative.    Physical Exam Updated Vital Signs BP 121/61 (BP Location: Right  Arm)   Pulse 87   Temp 98.3 F (36.8 C) (Oral)   Resp 18   LMP 02/16/2017 (Exact Date)   SpO2 100%   Physical Exam  Constitutional: She is oriented to person, place, and time. She appears well-developed and well-nourished.  Non-toxic appearance. No distress.  HENT:  Head: Normocephalic and atraumatic.  Eyes: Pupils are equal, round, and reactive to light. Conjunctivae, EOM and lids are normal.  Neck: Normal range of motion. Neck supple. No tracheal deviation present. No thyroid mass present.  Cardiovascular: Normal rate, regular rhythm and normal heart sounds.  Exam reveals no gallop.   No murmur heard. Pulmonary/Chest: Effort normal and breath sounds normal. No stridor. No respiratory distress. She has no decreased breath sounds. She has no wheezes. She has no rhonchi. She has no rales.  Abdominal: Soft. Normal appearance and bowel sounds are normal. She exhibits no distension. There is no tenderness. There is no rigidity, no rebound, no guarding and no CVA tenderness.  Musculoskeletal: Normal range of motion. She exhibits no edema or tenderness.  Neurological: She is alert and oriented to person, place, and time. She has normal strength. No cranial nerve deficit or sensory deficit. GCS eye subscore is 4. GCS verbal subscore is 5. GCS motor subscore is 6.  Skin: Skin is warm and dry. No abrasion and no rash noted.  Psychiatric: She has a normal mood and affect. Her speech is normal and behavior is normal.  Nursing note and vitals reviewed.    ED Treatments / Results  Labs (all labs ordered are listed, but only abnormal results are displayed) Labs Reviewed  CBC WITH DIFFERENTIAL/PLATELET  BASIC METABOLIC PANEL  URINALYSIS, ROUTINE W REFLEX MICROSCOPIC    EKG  EKG Interpretation None       Radiology No results found.  Procedures Procedures (including critical care time)  Medications Ordered in ED Medications  0.9 %  sodium chloride infusion (not administered)  sodium  chloride 0.9 % bolus 2,000 mL (not administered)  morphine 4 MG/ML injection 4 mg (not administered)  ondansetron (ZOFRAN) injection 4 mg (not administered)     Initial Impression / Assessment and Plan / ED Course  I have reviewed the triage vital signs and the nursing notes.  Pertinent labs & imaging results that were available during my care of the patient were reviewed by me and considered in my medical decision making (see chart for details).     Patient treated with fluids as well as Rocephin here. Given pain control. Patient is urine shows infection and CT is pending at this time. Signed out to Dr. Read Drivers  Final Clinical Impressions(s) / ED Diagnoses   Final diagnoses:  None    New Prescriptions New Prescriptions   No medications on file     Lorre Nick, MD 03/07/17 2354

## 2017-03-08 ENCOUNTER — Encounter (HOSPITAL_COMMUNITY): Payer: Self-pay | Admitting: Internal Medicine

## 2017-03-08 DIAGNOSIS — N179 Acute kidney failure, unspecified: Secondary | ICD-10-CM | POA: Diagnosis not present

## 2017-03-08 DIAGNOSIS — D696 Thrombocytopenia, unspecified: Secondary | ICD-10-CM | POA: Diagnosis not present

## 2017-03-08 DIAGNOSIS — N12 Tubulo-interstitial nephritis, not specified as acute or chronic: Secondary | ICD-10-CM | POA: Diagnosis present

## 2017-03-08 DIAGNOSIS — R197 Diarrhea, unspecified: Secondary | ICD-10-CM | POA: Diagnosis present

## 2017-03-08 DIAGNOSIS — R1084 Generalized abdominal pain: Secondary | ICD-10-CM

## 2017-03-08 DIAGNOSIS — N10 Acute tubulo-interstitial nephritis: Secondary | ICD-10-CM | POA: Diagnosis not present

## 2017-03-08 DIAGNOSIS — G43909 Migraine, unspecified, not intractable, without status migrainosus: Secondary | ICD-10-CM | POA: Diagnosis present

## 2017-03-08 DIAGNOSIS — D649 Anemia, unspecified: Secondary | ICD-10-CM | POA: Diagnosis not present

## 2017-03-08 DIAGNOSIS — J45909 Unspecified asthma, uncomplicated: Secondary | ICD-10-CM | POA: Insufficient documentation

## 2017-03-08 DIAGNOSIS — F17290 Nicotine dependence, other tobacco product, uncomplicated: Secondary | ICD-10-CM | POA: Diagnosis present

## 2017-03-08 DIAGNOSIS — Z720 Tobacco use: Secondary | ICD-10-CM | POA: Diagnosis present

## 2017-03-08 DIAGNOSIS — R109 Unspecified abdominal pain: Secondary | ICD-10-CM | POA: Diagnosis present

## 2017-03-08 DIAGNOSIS — K529 Noninfective gastroenteritis and colitis, unspecified: Secondary | ICD-10-CM | POA: Diagnosis present

## 2017-03-08 DIAGNOSIS — D6959 Other secondary thrombocytopenia: Secondary | ICD-10-CM | POA: Diagnosis present

## 2017-03-08 DIAGNOSIS — Z87440 Personal history of urinary (tract) infections: Secondary | ICD-10-CM | POA: Diagnosis not present

## 2017-03-08 DIAGNOSIS — E860 Dehydration: Secondary | ICD-10-CM | POA: Diagnosis present

## 2017-03-08 LAB — CREATININE, URINE, RANDOM: CREATININE, URINE: 34.18 mg/dL

## 2017-03-08 LAB — BASIC METABOLIC PANEL
Anion gap: 9 (ref 5–15)
BUN: 12 mg/dL (ref 6–20)
CO2: 18 mmol/L — ABNORMAL LOW (ref 22–32)
CREATININE: 1.23 mg/dL — AB (ref 0.44–1.00)
Calcium: 7.7 mg/dL — ABNORMAL LOW (ref 8.9–10.3)
Chloride: 114 mmol/L — ABNORMAL HIGH (ref 101–111)
GFR, EST NON AFRICAN AMERICAN: 57 mL/min — AB (ref >60)
Glucose, Bld: 90 mg/dL (ref 65–99)
POTASSIUM: 3.7 mmol/L (ref 3.5–5.1)
SODIUM: 141 mmol/L (ref 135–145)

## 2017-03-08 LAB — CBC
HEMATOCRIT: 30.3 % — AB (ref 36.0–46.0)
Hemoglobin: 10.4 g/dL — ABNORMAL LOW (ref 12.0–15.0)
MCH: 27.7 pg (ref 26.0–34.0)
MCHC: 34.3 g/dL (ref 30.0–36.0)
MCV: 80.6 fL (ref 78.0–100.0)
PLATELETS: 99 10*3/uL — AB (ref 150–400)
RBC: 3.76 MIL/uL — AB (ref 3.87–5.11)
RDW: 16.1 % — AB (ref 11.5–15.5)
WBC: 6.7 10*3/uL (ref 4.0–10.5)

## 2017-03-08 LAB — SODIUM, URINE, RANDOM: Sodium, Ur: 79 mmol/L

## 2017-03-08 MED ORDER — ACETAMINOPHEN 325 MG PO TABS
650.0000 mg | ORAL_TABLET | Freq: Four times a day (QID) | ORAL | Status: DC | PRN
  Administered 2017-03-08 – 2017-03-10 (×2): 650 mg via ORAL
  Filled 2017-03-08 (×3): qty 2

## 2017-03-08 MED ORDER — ONDANSETRON HCL 4 MG/2ML IJ SOLN: 4.0000 mg | Freq: Three times a day (TID) | INTRAMUSCULAR | Status: DC | PRN

## 2017-03-08 MED ORDER — MORPHINE SULFATE (PF) 2 MG/ML IV SOLN
2.0000 mg | Freq: Once | INTRAVENOUS | Status: AC
  Administered 2017-03-08: 2 mg via INTRAVENOUS
  Filled 2017-03-08: qty 1

## 2017-03-08 MED ORDER — MORPHINE SULFATE (PF) 2 MG/ML IV SOLN
2.0000 mg | INTRAVENOUS | Status: DC | PRN
  Administered 2017-03-08 – 2017-03-09 (×6): 2 mg via INTRAVENOUS
  Filled 2017-03-08 (×5): qty 1

## 2017-03-08 MED ORDER — ACETAMINOPHEN 650 MG RE SUPP: 650.0000 mg | Freq: Four times a day (QID) | RECTAL | Status: DC | PRN

## 2017-03-08 MED ORDER — LEVOFLOXACIN IN D5W 500 MG/100ML IV SOLN
500.0000 mg | INTRAVENOUS | Status: DC
  Administered 2017-03-08 – 2017-03-09 (×2): 500 mg via INTRAVENOUS
  Filled 2017-03-08 (×2): qty 100

## 2017-03-08 MED ORDER — ENOXAPARIN SODIUM 40 MG/0.4ML ~~LOC~~ SOLN
40.0000 mg | SUBCUTANEOUS | Status: DC
  Administered 2017-03-08 – 2017-03-09 (×2): 40 mg via SUBCUTANEOUS
  Filled 2017-03-08 (×2): qty 0.4

## 2017-03-08 MED ORDER — ALBUTEROL SULFATE (2.5 MG/3ML) 0.083% IN NEBU: 2.5000 mg | INHALATION_SOLUTION | RESPIRATORY_TRACT | Status: DC | PRN

## 2017-03-08 MED ORDER — ZOLPIDEM TARTRATE 5 MG PO TABS
5.0000 mg | ORAL_TABLET | Freq: Every evening | ORAL | Status: DC | PRN
  Administered 2017-03-08 – 2017-03-09 (×2): 5 mg via ORAL
  Filled 2017-03-08 (×3): qty 1

## 2017-03-08 MED ORDER — SODIUM CHLORIDE 0.9 % IV BOLUS (SEPSIS)
500.0000 mL | Freq: Once | INTRAVENOUS | Status: AC
  Administered 2017-03-08: 500 mL via INTRAVENOUS

## 2017-03-08 MED ORDER — NICOTINE 21 MG/24HR TD PT24
21.0000 mg | MEDICATED_PATCH | Freq: Every day | TRANSDERMAL | Status: DC
  Administered 2017-03-08 – 2017-03-10 (×3): 21 mg via TRANSDERMAL
  Filled 2017-03-08 (×3): qty 1

## 2017-03-08 MED ORDER — MORPHINE SULFATE (PF) 2 MG/ML IV SOLN
INTRAVENOUS | Status: AC
  Administered 2017-03-08: 2 mg via INTRAVENOUS
  Filled 2017-03-08: qty 1

## 2017-03-08 NOTE — ED Provider Notes (Addendum)
Nursing notes and vitals signs, including pulse oximetry, reviewed.  Summary of this visit's results, reviewed by myself:  EKG:  EKG Interpretation  Date/Time:    Ventricular Rate:    PR Interval:    QRS Duration:   QT Interval:    QTC Calculation:   R Axis:     Text Interpretation:         Labs:  Results for orders placed or performed during the hospital encounter of 03/07/17 (from the past 24 hour(s))  CBC with Differential/Platelet     Status: Abnormal   Collection Time: 03/07/17 10:10 PM  Result Value Ref Range   WBC 8.6 4.0 - 10.5 K/uL   RBC 4.51 3.87 - 5.11 MIL/uL   Hemoglobin 12.6 12.0 - 15.0 g/dL   HCT 16.1 (L) 09.6 - 04.5 %   MCV 78.9 78.0 - 100.0 fL   MCH 27.9 26.0 - 34.0 pg   MCHC 35.4 30.0 - 36.0 g/dL   RDW 40.9 (H) 81.1 - 91.4 %   Platelets 120 (L) 150 - 400 K/uL   Neutrophils Relative % 70 %   Neutro Abs 6.0 1.7 - 7.7 K/uL   Lymphocytes Relative 15 %   Lymphs Abs 1.2 0.7 - 4.0 K/uL   Monocytes Relative 13 %   Monocytes Absolute 1.1 (H) 0.1 - 1.0 K/uL   Eosinophils Relative 3 %   Eosinophils Absolute 0.2 0.0 - 0.7 K/uL   Basophils Relative 0 %   Basophils Absolute 0.0 0.0 - 0.1 K/uL  Basic metabolic panel     Status: Abnormal   Collection Time: 03/07/17 10:10 PM  Result Value Ref Range   Sodium 140 135 - 145 mmol/L   Potassium 3.7 3.5 - 5.1 mmol/L   Chloride 110 101 - 111 mmol/L   CO2 20 (L) 22 - 32 mmol/L   Glucose, Bld 95 65 - 99 mg/dL   BUN 16 6 - 20 mg/dL   Creatinine, Ser 7.82 (H) 0.44 - 1.00 mg/dL   Calcium 9.1 8.9 - 95.6 mg/dL   GFR calc non Af Amer 41 (L) >60 mL/min   GFR calc Af Amer 48 (L) >60 mL/min   Anion gap 10 5 - 15  Urinalysis, Routine w reflex microscopic     Status: Abnormal   Collection Time: 03/07/17 10:10 PM  Result Value Ref Range   Color, Urine AMBER (A) YELLOW   APPearance HAZY (A) CLEAR   Specific Gravity, Urine 1.014 1.005 - 1.030   pH 5.0 5.0 - 8.0   Glucose, UA NEGATIVE NEGATIVE mg/dL   Hgb urine dipstick  MODERATE (A) NEGATIVE   Bilirubin Urine NEGATIVE NEGATIVE   Ketones, ur NEGATIVE NEGATIVE mg/dL   Protein, ur 213 (A) NEGATIVE mg/dL   Nitrite POSITIVE (A) NEGATIVE   Leukocytes, UA SMALL (A) NEGATIVE   RBC / HPF 6-30 0 - 5 RBC/hpf   WBC, UA TOO NUMEROUS TO COUNT 0 - 5 WBC/hpf   Bacteria, UA RARE (A) NONE SEEN   Squamous Epithelial / LPF 0-5 (A) NONE SEEN   WBC Clumps PRESENT    Mucous PRESENT    Budding Yeast PRESENT    Non Squamous Epithelial 0-5 (A) NONE SEEN  Pregnancy, urine     Status: None   Collection Time: 03/07/17 10:10 PM  Result Value Ref Range   Preg Test, Ur NEGATIVE NEGATIVE    Imaging Studies: Ct Renal Stone Study  Result Date: 03/08/2017 CLINICAL DATA:  Abdominal and LEFT flank pain, nausea and diarrhea.  Recent diagnosis of urinary tract infection, was not completed antibiotics. History of renal stones. EXAM: CT ABDOMEN AND PELVIS WITHOUT CONTRAST TECHNIQUE: Multidetector CT imaging of the abdomen and pelvis was performed following the standard protocol without IV contrast. COMPARISON:  CT abdomen and pelvis June 27, 2016 FINDINGS: LOWER CHEST: Minimal atelectasis. The visualized heart size is normal. No pericardial effusion. HEPATOBILIARY: Larey SeatFell status post cholecystectomy. Normal noncontrast CT liver. PANCREAS: Normal. SPLEEN: Normal. ADRENALS/URINARY TRACT: Kidneys are orthotopic, demonstrating normal size and morphology. No nephrolithiasis, hydronephrosis; limited assessment for renal masses on this nonenhanced examination. The unopacified ureters are normal in course and caliber. Urinary bladder is partially distended and unremarkable. Normal adrenal glands. STOMACH/BOWEL: The stomach, small and large bowel are normal in course and caliber without inflammatory changes, sensitivity decreased by lack of enteric contrast. Small and large bowel air-fluid levels. Normal appendix. VASCULAR/LYMPHATIC: Aortoiliac vessels are normal in course and caliber. No lymphadenopathy by  CT size criteria. REPRODUCTIVE: Normal. OTHER: No intraperitoneal free fluid or free air. MUSCULOSKELETAL: Non-acute. Small fat containing supraumbilical ventral hernia. Anterior pelvic wall scarring. IMPRESSION: 1. No nephrolithiasis or obstructive uropathy. 2. Fluid within small and large bowel seen with enteritis. Electronically Signed   By: Awilda Metroourtnay  Bloomer M.D.   On: 03/08/2017 00:23   12:46 AM Dr. Clyde LundborgNiu of hospitalists consulted.    Kendrick Remigio, Jonny RuizJohn, MD 03/08/17 0029    Paula LibraMolpus, Jaidah Lomax, MD 03/08/17 [phone]

## 2017-03-08 NOTE — H&P (Signed)
History and Physical    Medina Degraffenreid ZOX:[id number] DOB: [birth year] DOA: 03/07/2017  Referring MD/NP/PA:   PCP: Truman Hayward, FNP   Patient coming from:  The patient is coming from home.  At baseline, pt is independent for most of ADL.  Chief Complaint: Left flank pain, dysuria, diarrhea, abdominal pain  HPI: Norma Moreno is a 36 y.o. female with medical history significant of asthma, back pain, tobacco abuse, who presents with left flank pain, dysuria, diarrhea, abdominal pain.  Patient states that she has been having left flank pain, dysuria, burning on urination and increased urinary frequency for almost 12 days. She was seen in ED on 7/10, and diagnosed as UTI patient was given prescription for Keflex, but she did not take Keflex until yesterday when she took 3 doses. Patient states that she continues to have left flank pain, dysuria and burning on urination. Her left flank pain is constant, sharp, 10 out of 10, nonradiating. It is not aggravated or elevated any factors. Patient also reports nausea, diarrhea and abdominal pain. No vomiting. She states that she has had 3-4 bowel movements with watery stool since yesterday. Her abdominal pain is diffuse, moderate, constant, nonradiating. Patient has subjective fever, sweating and chills. She denies chest pain, cough, SOB, unilateral weakness..   ED Course: pt was found to have Positive urinalysis with small amount of leukocyte and a positive nitrite, WBC 8.6, negative pregnancy test, acute renal injury with creatinine 1.59, temperature normal, no tachycardia, oxygen saturation 97% on room air. CT per renal stone protocol showed enteritis, but no kidney stone.   Review of Systems:   General: has subjective fevers, chills, no changes in body weight, has poor appetite, has fatigue HEENT: no blurry vision, hearing changes or sore throat Respiratory: no dyspnea, coughing, wheezing CV: no chest pain, no palpitations GI: has nausea,   abdominal pain, no diarrhea, constipationvomiting, GU: has dysuria, burning on urination, increased urinary frequency, hematuria  Ext: no leg edema Neuro: no unilateral weakness, numbness, or tingling, no vision change or hearing loss Skin: no rash, no skin tear. MSK: No muscle spasm, no deformity, no limitation of range of movement in spin Heme: No easy bruising.  Travel history: No recent long distant travel.  Allergy: No Known Allergies  Past Medical History:  Diagnosis Date  . Arthritis   . Asthma   . Back pain   . Tobacco abuse     Past Surgical History:  Procedure Laterality Date  . CESAREAN SECTION     x 4  . CHOLECYSTECTOMY      Social History:  reports that she has been smoking Cigars.  She has never used smokeless tobacco. She reports that she does not drink alcohol or use drugs.  Family History:  Family History  Problem Relation Age of Onset  . Kidney Stones Father      Prior to Admission medications   Medication Sig Start Date End Date Taking? Authorizing Provider  cephALEXin (KEFLEX) 500 MG capsule Take 1 capsule (500 mg total) by mouth 2 (two) times daily. 03/03/17  Yes Roxy Horseman, PA-C  ibuprofen (ADVIL,MOTRIN) 600 MG tablet Take 1 tablet (600 mg total) by mouth every 8 (eight) hours as needed for moderate pain. 02/28/17  Yes Zadie Rhine, MD  phenazopyridine (PYRIDIUM) 95 MG tablet Take 1 tablet (95 mg total) by mouth 3 (three) times daily as needed for pain. 03/03/17  Yes Roxy Horseman, PA-C    Physical Exam: Vitals:   03/07/17 1925 03/07/17 1927 03/07/17  2227 03/07/17 2340  BP:  121/61 131/74 116/71  Pulse:  87 84 78  Resp:  18 18 18   Temp:  98.3 F (36.8 C) 98.2 F (36.8 C) 98 F (36.7 C)  TempSrc:  Oral Oral Oral  SpO2: 97% 100% 100% 100%   General: Not in acute distress. Dry mucus and membrane. HEENT:       Eyes: PERRL, EOMI, no scleral icterus.       ENT: No discharge from the ears and nose, no pharynx injection, no tonsillar  enlargement.        Neck: No JVD, no bruit, no mass felt. Heme: No neck lymph node enlargement. Cardiac: S1/S2, RRR, No murmurs, No gallops or rubs. Respiratory:  No rales, wheezing, rhonchi or rubs. GI: Soft, nondistended, has diffused tenderness, no rebound pain, no organomegaly, BS present. GU: has left CVA tenderness. Ext: No pitting leg edema bilaterally. 2+DP/PT pulse bilaterally. Musculoskeletal: No joint deformities, No joint redness or warmth, no limitation of ROM in spin. Skin: No rashes.  Neuro: Alert, oriented X3, cranial nerves II-XII grossly intact, moves all extremities normally. Psych: Patient is not psychotic, no suicidal or hemocidal ideation.  Labs on Admission: I have personally reviewed following labs and imaging studies  CBC:  Recent Labs Lab 03/07/17 2210  WBC 8.6  NEUTROABS 6.0  HGB 12.6  HCT 35.6*  MCV 78.9  PLT 120*   Basic Metabolic Panel:  Recent Labs Lab 03/07/17 2210  NA 140  K 3.7  CL 110  CO2 20*  GLUCOSE 95  BUN 16  CREATININE 1.59*  CALCIUM 9.1   GFR: Estimated Creatinine Clearance: 47.9 mL/min (A) (by C-G formula based on SCr of 1.59 mg/dL (H)). Liver Function Tests: No results for input(s): AST, ALT, ALKPHOS, BILITOT, PROT, ALBUMIN in the last 168 hours. No results for input(s): LIPASE, AMYLASE in the last 168 hours. No results for input(s): AMMONIA in the last 168 hours. Coagulation Profile: No results for input(s): INR, PROTIME in the last 168 hours. Cardiac Enzymes: No results for input(s): CKTOTAL, CKMB, CKMBINDEX, TROPONINI in the last 168 hours. BNP (last 3 results) No results for input(s): PROBNP in the last 8760 hours. HbA1C: No results for input(s): HGBA1C in the last 72 hours. CBG: No results for input(s): GLUCAP in the last 168 hours. Lipid Profile: No results for input(s): CHOL, HDL, LDLCALC, TRIG, CHOLHDL, LDLDIRECT in the last 72 hours. Thyroid Function Tests: No results for input(s): TSH, T4TOTAL, FREET4,  T3FREE, THYROIDAB in the last 72 hours. Anemia Panel: No results for input(s): VITAMINB12, FOLATE, FERRITIN, TIBC, IRON, RETICCTPCT in the last 72 hours. Urine analysis:    Component Value Date/Time   COLORURINE AMBER (A) 03/07/2017 2210   APPEARANCEUR HAZY (A) 03/07/2017 2210   LABSPEC 1.014 03/07/2017 2210   PHURINE 5.0 03/07/2017 2210   GLUCOSEU NEGATIVE 03/07/2017 2210   HGBUR MODERATE (A) 03/07/2017 2210   BILIRUBINUR NEGATIVE 03/07/2017 2210   KETONESUR NEGATIVE 03/07/2017 2210   PROTEINUR 100 (A) 03/07/2017 2210   NITRITE POSITIVE (A) 03/07/2017 2210   LEUKOCYTESUR SMALL (A) 03/07/2017 2210   Sepsis Labs: @LABRCNTIP (procalcitonin:4,lacticidven:4) ) Recent Results (from the past 240 hour(s))  Urine culture     Status: Abnormal   Collection Time: 03/02/17 10:15 PM  Result Value Ref Range Status   Specimen Description URINE, RANDOM  Final   Special Requests NONE  Final   Culture 60,000 COLONIES/mL ESCHERICHIA COLI (A)  Final   Report Status 03/05/2017 FINAL  Final   Organism ID,  Bacteria ESCHERICHIA COLI (A)  Final      Susceptibility   Escherichia coli - MIC*    AMPICILLIN 4 SENSITIVE Sensitive     CEFAZOLIN <=4 SENSITIVE Sensitive     CEFTRIAXONE <=1 SENSITIVE Sensitive     CIPROFLOXACIN <=0.25 SENSITIVE Sensitive     GENTAMICIN <=1 SENSITIVE Sensitive     IMIPENEM <=0.25 SENSITIVE Sensitive     NITROFURANTOIN <=16 SENSITIVE Sensitive     TRIMETH/SULFA >=320 RESISTANT Resistant     AMPICILLIN/SULBACTAM <=2 SENSITIVE Sensitive     PIP/TAZO <=4 SENSITIVE Sensitive     Extended ESBL NEGATIVE Sensitive     * 60,000 COLONIES/mL ESCHERICHIA COLI     Radiological Exams on Admission: Ct Renal Stone Study  Result Date: 03/08/2017 CLINICAL DATA:  Abdominal and LEFT flank pain, nausea and diarrhea. Recent diagnosis of urinary tract infection, was not completed antibiotics. History of renal stones. EXAM: CT ABDOMEN AND PELVIS WITHOUT CONTRAST TECHNIQUE: Multidetector CT  imaging of the abdomen and pelvis was performed following the standard protocol without IV contrast. COMPARISON:  CT abdomen and pelvis June 27, 2016 FINDINGS: LOWER CHEST: Minimal atelectasis. The visualized heart size is normal. No pericardial effusion. HEPATOBILIARY: Larey SeatFell status post cholecystectomy. Normal noncontrast CT liver. PANCREAS: Normal. SPLEEN: Normal. ADRENALS/URINARY TRACT: Kidneys are orthotopic, demonstrating normal size and morphology. No nephrolithiasis, hydronephrosis; limited assessment for renal masses on this nonenhanced examination. The unopacified ureters are normal in course and caliber. Urinary bladder is partially distended and unremarkable. Normal adrenal glands. STOMACH/BOWEL: The stomach, small and large bowel are normal in course and caliber without inflammatory changes, sensitivity decreased by lack of enteric contrast. Small and large bowel air-fluid levels. Normal appendix. VASCULAR/LYMPHATIC: Aortoiliac vessels are normal in course and caliber. No lymphadenopathy by CT size criteria. REPRODUCTIVE: Normal. OTHER: No intraperitoneal free fluid or free air. MUSCULOSKELETAL: Non-acute. Small fat containing supraumbilical ventral hernia. Anterior pelvic wall scarring. IMPRESSION: 1. No nephrolithiasis or obstructive uropathy. 2. Fluid within small and large bowel seen with enteritis. Electronically Signed   By: Awilda Metroourtnay  Bloomer M.D.   On: 03/08/2017 00:23     EKG:   Not done in ED, will get one.   Assessment/Plan Principal Problem:   Acute pyelonephritis Active Problems:   Tobacco abuse   AKI (acute kidney injury) (HCC)   Thrombocytopenia (HCC)   Diarrhea   Abdominal pain   Pyelonephritis   Acute pyelonephritis: pt has left CVA tenderness, positive urinalysis, consistent with pyelonephritis. CT scan did not show kidney stone. No fever or leukocytosis. Clinically nonseptic. Hemodynamically stable.  -  Place on tele bed for obs -  Ceftriaxone by IV - Follow up  results of urine and blood cx and amend antibiotic regimen if needed per sensitivity results - prn Zofran for nausea - will get Procalcitonin and trend lactic acid levels per sepsis protocol. - IVF: 3L of NS bolus in ED, followed by 125 cc/h   Diarrhea and abdominal pain: CT scan showed enteritis. -When necessary Zofran for nausea and morphine for pain -F/u C. difficile PCR  Tobacco abuse: -Did counseling about importance of quitting smoking -Nicotine patch  AKI: Likely due to pyelonephritis and prerenal secondary to dehydration and continuation of NSAIDs - IVF as above - Check FeNa - Follow up renal function by BMP - Avoid ibuprofen  Thrombocytopenia (HCC): Platelet of 120, likely due to ongoing infection. Mental status normal. -Follow-up by CBC   DVT ppx:SQ Lovenox Code Status: Full code Family Communication: None at bed side.  Disposition Plan:  Anticipate discharge back to previous home environment Consults called: none  Admission status: medical floor/obs     Date of Service 03/08/2017    Lorretta Harp [facility] Pager [phone]  If 7PM-7AM, please contact night-coverage [email or web address] Password [email or web address] 03/08/2017, 1:06 AM

## 2017-03-08 NOTE — Progress Notes (Signed)
    Patient admitted after midnight see H&P for further details.  36 year old female with medical history significant for asthma, back pain and thoracotomy is presented to the emergency department complaining of left flank pain, dysuria, abdominal pain and diarrhea. She was treated for UTI 3 days ago but was not able to take Keflex as prescribed. Now she is being admitted for pyelonephritis.  Patient chart reviewed.  Plan: Will switch ceftriaxone for Levaquin Await urine culture Advance diet as tolerated. Pain control when necessary  Rest as per H&P  Latrelle DodrillEdwin Silva, MD

## 2017-03-09 DIAGNOSIS — K529 Noninfective gastroenteritis and colitis, unspecified: Secondary | ICD-10-CM

## 2017-03-09 LAB — HIV ANTIBODY (ROUTINE TESTING W REFLEX): HIV SCREEN 4TH GENERATION: NONREACTIVE

## 2017-03-09 MED ORDER — OXYCODONE-ACETAMINOPHEN 5-325 MG PO TABS
1.0000 | ORAL_TABLET | Freq: Four times a day (QID) | ORAL | Status: DC | PRN
  Administered 2017-03-09 – 2017-03-10 (×3): 2 via ORAL
  Filled 2017-03-09 (×4): qty 2

## 2017-03-09 NOTE — Progress Notes (Signed)
Temp 98.7.wbb

## 2017-03-09 NOTE — Progress Notes (Signed)
Nursing Note: Temp 101.9 A; Paged on-call.Bolus 500 NS ,tylenol given.wbb

## 2017-03-09 NOTE — Progress Notes (Addendum)
PROGRESS NOTE [facility]   Norma Moreno   ZOX:[id number] DOB: [birth year]  DOA: 03/07/2017 PCP: Truman Hayward, FNP   Brief Narrative:   36 year old female with medical history significant for asthma, back pain and thoracotomy is presented to the emergency department complaining of left flank pain, dysuria, abdominal pain and diarrhea. She was treated for UTI 3 days ago but did not took Keflex as prescribed. Now she is being admitted for pyelonephritis. Previous urine culture show Escherichia coli only resistant to Bactrim. Patient was place on IV Levaquin.   Subjective: Patient seen and examined reported her pain has improved some, not tolerating diet well. No further diarrhea. Patient febrile overnight  Assessment & Plan: Acute pyelonephritis Failure of outpatient treatment Continue IV Levaquin for now - if continue improvement will switch to oral antibiotics in AM Patient not tolerating diet well with switch IV pain medication to oral medication. Urine culture was not plated on admission, and now no urine available to cultivate. Will treat accordingly to prior urine culture and follow-up clinically. Continue supportive treatment  Enteritis - unlikely to be C. difficile Patient on Levaquin Advancing diet as tolerated  Acute kidney injury Secondary to pyelonephritis and dehydration Improved with IV fluid Check BMP in AM   Tobacco abuse Tobacco counseling cessation given Continue nicotine patch  Thrombocytopenia Likely related to infectious process We'll discontinue Lovenox Monitor CBC   DVT prophylaxis: Encourage ambulation Code Status: Full code Family Communication: None at bedside Disposition Plan: Home in the next 24 hours if remains afebrile  Consultants:   None  Procedures:   None  Antimicrobials: Anti-infectives    Start     Dose/Rate Route Frequency Ordered Stop   03/08/17 1600  levofloxacin (LEVAQUIN) IVPB 500 mg     500 mg 100 mL/hr  over 60 Minutes Intravenous Every 24 hours 03/08/17 1507     03/07/17 2215  cefTRIAXone (ROCEPHIN) 1 g in dextrose 5 % 50 mL IVPB  Status:  Discontinued     1 g 100 mL/hr over 30 Minutes Intravenous Every 24 hours 03/07/17 2200 03/08/17 1507        Objective: Vitals:   03/08/17 0215 03/08/17 2036 03/08/17 2339 03/09/17 0432  BP: 106/65 124/76  116/78  Pulse: 80 80  65  Resp: 16 18  18   Temp: 98.2 F (36.8 C) (!) 101.9 F (38.8 C) 98.7 F (37.1 C) 98.3 F (36.8 C)  TempSrc: Oral Oral Oral Oral  SpO2: 100% 100%  99%  Weight: 69 kg (152 lb 1.9 oz)     Height: 5' 4.5" (1.638 m)       Intake/Output Summary (Last 24 hours) at 03/09/17 1150 Last data filed at 03/09/17 0732  Gross per 24 hour  Intake          3049.17 ml  Output                0 ml  Net          3049.17 ml   Filed Weights   03/08/17 0215  Weight: 69 kg (152 lb 1.9 oz)    Examination:  General exam: Appears calm and comfortable  HEENT: OP moist and clear Respiratory system: Clear to auscultation. No wheezes,crackle or rhonchi Cardiovascular system: S1 & S2 heard, RRR. No JVD, murmurs, rubs or gallops Gastrointestinal system: Soft, nondistended, Lf flank and lower quadrant tenderness, + CVA tenderness, positive bowel sounds Central nervous system: Alert and oriented. . Extremities: No pedal edema. Symmetric  Skin: No rashes,  lesions or ulcers Psychiatry: Judgement and insight appear normal. Mood & affect appropriate.   Data Reviewed: I have personally reviewed following labs and imaging studies  CBC:  Recent Labs Lab 03/07/17 2210 03/08/17 0407  WBC 8.6 6.7  NEUTROABS 6.0  --   HGB 12.6 10.4*  HCT 35.6* 30.3*  MCV 78.9 80.6  PLT 120* 99*   Basic Metabolic Panel:  Recent Labs Lab 03/07/17 2210 03/08/17 0407  NA 140 141  K 3.7 3.7  CL 110 114*  CO2 20* 18*  GLUCOSE 95 90  BUN 16 12  CREATININE 1.59* 1.23*  CALCIUM 9.1 7.7*   GFR: Estimated Creatinine Clearance: 62.2 mL/min (A) (by  C-G formula based on SCr of 1.23 mg/dL (H)).   Recent Results (from the past 240 hour(s))  Urine culture     Status: Abnormal   Collection Time: 03/02/17 10:15 PM  Result Value Ref Range Status   Specimen Description URINE, RANDOM  Final   Special Requests NONE  Final   Culture 60,000 COLONIES/mL ESCHERICHIA COLI (A)  Final   Report Status 03/05/2017 FINAL  Final   Organism ID, Bacteria ESCHERICHIA COLI (A)  Final      Susceptibility   Escherichia coli - MIC*    AMPICILLIN 4 SENSITIVE Sensitive     CEFAZOLIN <=4 SENSITIVE Sensitive     CEFTRIAXONE <=1 SENSITIVE Sensitive     CIPROFLOXACIN <=0.25 SENSITIVE Sensitive     GENTAMICIN <=1 SENSITIVE Sensitive     IMIPENEM <=0.25 SENSITIVE Sensitive     NITROFURANTOIN <=16 SENSITIVE Sensitive     TRIMETH/SULFA >=320 RESISTANT Resistant     AMPICILLIN/SULBACTAM <=2 SENSITIVE Sensitive     PIP/TAZO <=4 SENSITIVE Sensitive     Extended ESBL NEGATIVE Sensitive     * 60,000 COLONIES/mL ESCHERICHIA COLI      Radiology Studies: Ct Renal Stone Study  Result Date: 03/08/2017 CLINICAL DATA:  Abdominal and LEFT flank pain, nausea and diarrhea. Recent diagnosis of urinary tract infection, was not completed antibiotics. History of renal stones. EXAM: CT ABDOMEN AND PELVIS WITHOUT CONTRAST TECHNIQUE: Multidetector CT imaging of the abdomen and pelvis was performed following the standard protocol without IV contrast. COMPARISON:  CT abdomen and pelvis June 27, 2016 FINDINGS: LOWER CHEST: Minimal atelectasis. The visualized heart size is normal. No pericardial effusion. HEPATOBILIARY: Larey SeatFell status post cholecystectomy. Normal noncontrast CT liver. PANCREAS: Normal. SPLEEN: Normal. ADRENALS/URINARY TRACT: Kidneys are orthotopic, demonstrating normal size and morphology. No nephrolithiasis, hydronephrosis; limited assessment for renal masses on this nonenhanced examination. The unopacified ureters are normal in course and caliber. Urinary bladder is  partially distended and unremarkable. Normal adrenal glands. STOMACH/BOWEL: The stomach, small and large bowel are normal in course and caliber without inflammatory changes, sensitivity decreased by lack of enteric contrast. Small and large bowel air-fluid levels. Normal appendix. VASCULAR/LYMPHATIC: Aortoiliac vessels are normal in course and caliber. No lymphadenopathy by CT size criteria. REPRODUCTIVE: Normal. OTHER: No intraperitoneal free fluid or free air. MUSCULOSKELETAL: Non-acute. Small fat containing supraumbilical ventral hernia. Anterior pelvic wall scarring. IMPRESSION: 1. No nephrolithiasis or obstructive uropathy. 2. Fluid within small and large bowel seen with enteritis. Electronically Signed   By: Awilda Metroourtnay  Bloomer M.D.   On: 03/08/2017 00:23    Scheduled Meds: . enoxaparin (LOVENOX) injection  40 mg Subcutaneous Q24H  . nicotine  21 mg Transdermal Daily   Continuous Infusions: . levofloxacin (LEVAQUIN) IV Stopped (03/08/17 1631)     LOS: 1 day    Time spent: Total of 25 minutes  spent with pt, greater than 50% of which was spent in discussion of  treatment, counseling and coordination of care   Latrelle Dodrill, MD Pager: Text Page via [email or web address]  [phone]  If 7PM-7AM, please contact night-coverage [email or web address] Password [email or web address] 03/09/2017, 11:50 AM

## 2017-03-10 LAB — BASIC METABOLIC PANEL
ANION GAP: 7 (ref 5–15)
BUN: 12 mg/dL (ref 6–20)
CALCIUM: 8.7 mg/dL — AB (ref 8.9–10.3)
CO2: 22 mmol/L (ref 22–32)
Chloride: 111 mmol/L (ref 101–111)
Creatinine, Ser: 0.98 mg/dL (ref 0.44–1.00)
GLUCOSE: 92 mg/dL (ref 65–99)
POTASSIUM: 3.6 mmol/L (ref 3.5–5.1)
Sodium: 140 mmol/L (ref 135–145)

## 2017-03-10 LAB — CBC WITH DIFFERENTIAL/PLATELET
BASOS ABS: 0 10*3/uL (ref 0.0–0.1)
BASOS PCT: 1 %
Eosinophils Absolute: 0.4 10*3/uL (ref 0.0–0.7)
Eosinophils Relative: 7 %
HEMATOCRIT: 26.9 % — AB (ref 36.0–46.0)
Hemoglobin: 9.4 g/dL — ABNORMAL LOW (ref 12.0–15.0)
LYMPHS PCT: 50 %
Lymphs Abs: 3 10*3/uL (ref 0.7–4.0)
MCH: 27.3 pg (ref 26.0–34.0)
MCHC: 34.9 g/dL (ref 30.0–36.0)
MCV: 78.2 fL (ref 78.0–100.0)
Monocytes Absolute: 0.6 10*3/uL (ref 0.1–1.0)
Monocytes Relative: 10 %
NEUTROS ABS: 1.9 10*3/uL (ref 1.7–7.7)
Neutrophils Relative %: 32 %
PLATELETS: 131 10*3/uL — AB (ref 150–400)
RBC: 3.44 MIL/uL — AB (ref 3.87–5.11)
RDW: 16 % — AB (ref 11.5–15.5)
WBC: 6 10*3/uL (ref 4.0–10.5)

## 2017-03-10 MED ORDER — LEVOFLOXACIN 500 MG PO TABS: 500.0000 mg | ORAL_TABLET | Freq: Every day | ORAL | 0 refills | Status: AC

## 2017-03-10 MED ORDER — ASPIRIN-ACETAMINOPHEN-CAFFEINE 250-250-65 MG PO TABS: 2.0000 | ORAL_TABLET | Freq: Three times a day (TID) | ORAL | 0 refills | Status: DC | PRN

## 2017-03-10 MED ORDER — ASPIRIN-ACETAMINOPHEN-CAFFEINE 250-250-65 MG PO TABS
2.0000 | ORAL_TABLET | Freq: Once | ORAL | Status: AC
  Administered 2017-03-10: 2 via ORAL
  Filled 2017-03-10: qty 2

## 2017-03-10 NOTE — Discharge Summary (Signed)
Physician Discharge Summary  Norma Moreno  ZOX:[id number]  DOB: 04/01/1981  DOA: 03/07/2017 PCP: Truman Hayward, FNP  Admit date: 03/07/2017 Discharge date: 03/10/2017  Admitted From: Home  Disposition:  Home   Recommendations for Outpatient Follow-up:  1. Follow up with PCP in 1 week  2. Please obtain BMP/CBC in 1 week to monitor Hgb and renal function  3. Please follow up on the following pending results: Final blood cultures  Discharge Condition: Stable  CODE STATUS: FULL  Diet recommendation:  Regular   Brief/Interim Summary: 36 year old female with medical history significant for asthma, back pain and thoracotomy is presented to the emergency department complaining of left flank pain, dysuria, abdominal pain and diarrhea. She was treated for UTI 3 days ago but did not took Keflex as prescribed. Patient admitted for pyelonephritis. Previous urine culture show Escherichia coli only resistant to Bactrim. Patient was place on IV Levaquin. Patient clinically improved, now tolerating diet well, afebrile and asymptomatic. Patient will continue abx therapy at home to follow up with PCP.   Subjective: Patient seen and examined on the day of discharge, today only c/o headaches, she reports that she gets migraines at home. Denies chest pain, SOB and palpitations. Abdominal pain has significantly improve. Patient remains afebrile for > 24 hrs.   Discharge Diagnoses/Hospital Course:  Acute pyelonephritis Failure of outpatient treatment Initially treated with IV Levaquin with good improvement - switched to oral Levaquin upon discharge . Urine culture was not plated on admission, and now no urine available to cultivate. Will treat accordingly to prior urine culture and follow-up clinically. Continue supportive treatment  Enteritis - unlikely to be C. difficile Patient on Levaquin should take care of abdominal infection  No diarrhea or bloody bowel movements.  Tolerating diet well    Acute kidney injury Secondary to pyelonephritis and dehydration Cr back to baseline  Check BMP in 1 week   Tobacco abuse Tobacco counseling cessation given   Thrombocytopenia - improving  Likely related to infectious process expect to improve with time  Lovenox was discontinue  Monitor CBC in 1 week   Normocytic anemia - hemodilution  Patient received aggressive IVF  No signs of over bleeding  Check CBC in 1 week   Chronic Migraine  Excedrin PRN pain   On the day of the discharge the patient's vitals were stable, and no other acute medical condition were reported by patient. Patient was felt safe to be discharge to home  Discharge Instructions  You were cared for by a hospitalist during your hospital stay. If you have any questions about your discharge medications or the care you received while you were in the hospital after you are discharged, you can call the unit and asked to speak with the hospitalist on call if the hospitalist that took care of you is not available. Once you are discharged, your primary care physician will handle any further medical issues. Please note that NO REFILLS for any discharge medications will be authorized once you are discharged, as it is imperative that you return to your primary care physician (or establish a relationship with a primary care physician if you do not have one) for your aftercare needs so that they can reassess your need for medications and monitor your lab values.  Discharge Instructions    Call MD for:  difficulty breathing, headache or visual disturbances    Complete by:  As directed    Call MD for:  extreme fatigue    Complete by:  As  directed    Call MD for:  hives    Complete by:  As directed    Call MD for:  persistant dizziness or light-headedness    Complete by:  As directed    Call MD for:  persistant nausea and vomiting    Complete by:  As directed    Call MD for:  redness, tenderness, or signs of infection  (pain, swelling, redness, odor or green/yellow discharge around incision site)    Complete by:  As directed    Call MD for:  severe uncontrolled pain    Complete by:  As directed    Call MD for:  temperature >100.4    Complete by:  As directed    Diet - low sodium heart healthy    Complete by:  As directed    Increase activity slowly    Complete by:  As directed      Allergies as of 03/10/2017   No Known Allergies     Medication List    STOP taking these medications   cephALEXin 500 MG capsule Commonly known as:  KEFLEX   ibuprofen 600 MG tablet Commonly known as:  ADVIL,MOTRIN   phenazopyridine 95 MG tablet Commonly known as:  PYRIDIUM     TAKE these medications   aspirin-acetaminophen-caffeine 250-250-65 MG tablet Commonly known as:  EXCEDRIN MIGRAINE Take 2 tablets by mouth every 8 (eight) hours as needed for headache or migraine.   levofloxacin 500 MG tablet Commonly known as:  LEVAQUIN Take 1 tablet (500 mg total) by mouth daily.       No Known Allergies  Consultations:  None    Procedures/Studies: Ct Renal Stone Study  Result Date: 03/08/2017 CLINICAL DATA:  Abdominal and LEFT flank pain, nausea and diarrhea. Recent diagnosis of urinary tract infection, was not completed antibiotics. History of renal stones. EXAM: CT ABDOMEN AND PELVIS WITHOUT CONTRAST TECHNIQUE: Multidetector CT imaging of the abdomen and pelvis was performed following the standard protocol without IV contrast. COMPARISON:  CT abdomen and pelvis June 27, 2016 FINDINGS: LOWER CHEST: Minimal atelectasis. The visualized heart size is normal. No pericardial effusion. HEPATOBILIARY: Larey Seat status post cholecystectomy. Normal noncontrast CT liver. PANCREAS: Normal. SPLEEN: Normal. ADRENALS/URINARY TRACT: Kidneys are orthotopic, demonstrating normal size and morphology. No nephrolithiasis, hydronephrosis; limited assessment for renal masses on this nonenhanced examination. The unopacified ureters  are normal in course and caliber. Urinary bladder is partially distended and unremarkable. Normal adrenal glands. STOMACH/BOWEL: The stomach, small and large bowel are normal in course and caliber without inflammatory changes, sensitivity decreased by lack of enteric contrast. Small and large bowel air-fluid levels. Normal appendix. VASCULAR/LYMPHATIC: Aortoiliac vessels are normal in course and caliber. No lymphadenopathy by CT size criteria. REPRODUCTIVE: Normal. OTHER: No intraperitoneal free fluid or free air. MUSCULOSKELETAL: Non-acute. Small fat containing supraumbilical ventral hernia. Anterior pelvic wall scarring. IMPRESSION: 1. No nephrolithiasis or obstructive uropathy. 2. Fluid within small and large bowel seen with enteritis. Electronically Signed   By: Awilda Metro M.D.   On: 03/08/2017 00:23     Discharge Exam: Vitals:   03/09/17 2058 03/10/17 0426  BP: 112/70 124/77  Pulse: 68 69  Resp: 16 16  Temp: 98.4 F (36.9 C) 98.3 F (36.8 C)   Vitals:   03/09/17 0432 03/09/17 1338 03/09/17 2058 03/10/17 0426  BP: 116/78 117/69 112/70 124/77  Pulse: 65 64 68 69  Resp: 18 16 16 16   Temp: 98.3 F (36.8 C) 98.3 F (36.8 C) 98.4 F (36.9 C) 98.3  F (36.8 C)  TempSrc: Oral Oral Oral Oral  SpO2: 99% 100% 99% 99%  Weight:      Height:        General: Pt is alert, awake, not in acute distress Abdominal: Soft, NT, ND, bowel sounds + Extremities: no edema, no   The results of significant diagnostics from this hospitalization (including imaging, microbiology, ancillary and laboratory) are listed below for reference.     Microbiology: Recent Results (from the past 240 hour(s))  Urine culture     Status: Abnormal   Collection Time: 03/02/17 10:15 PM  Result Value Ref Range Status   Specimen Description URINE, RANDOM  Final   Special Requests NONE  Final   Culture 60,000 COLONIES/mL ESCHERICHIA COLI (A)  Final   Report Status 03/05/2017 FINAL  Final   Organism ID, Bacteria  ESCHERICHIA COLI (A)  Final      Susceptibility   Escherichia coli - MIC*    AMPICILLIN 4 SENSITIVE Sensitive     CEFAZOLIN <=4 SENSITIVE Sensitive     CEFTRIAXONE <=1 SENSITIVE Sensitive     CIPROFLOXACIN <=0.25 SENSITIVE Sensitive     GENTAMICIN <=1 SENSITIVE Sensitive     IMIPENEM <=0.25 SENSITIVE Sensitive     NITROFURANTOIN <=16 SENSITIVE Sensitive     TRIMETH/SULFA >=320 RESISTANT Resistant     AMPICILLIN/SULBACTAM <=2 SENSITIVE Sensitive     PIP/TAZO <=4 SENSITIVE Sensitive     Extended ESBL NEGATIVE Sensitive     * 60,000 COLONIES/mL ESCHERICHIA COLI  Culture, blood (Routine X 2) w Reflex to ID Panel     Status: None (Preliminary result)   Collection Time: 03/08/17  1:30 AM  Result Value Ref Range Status   Specimen Description BLOOD LEFT HAND  Final   Special Requests   Final    BOTTLES DRAWN AEROBIC AND ANAEROBIC Blood Culture adequate volume   Culture   Final    NO GROWTH 1 DAY Performed at [facility], [address] [place], [place] [address]    Report Status PENDING  Incomplete  Culture, blood (Routine X 2) w Reflex to ID Panel     Status: None (Preliminary result)   Collection Time: 03/08/17  1:30 AM  Result Value Ref Range Status   Specimen Description BLOOD LEFT ANTECUBITAL  Final   Special Requests   Final    BOTTLES DRAWN AEROBIC AND ANAEROBIC Blood Culture adequate volume   Culture   Final    NO GROWTH 1 DAY Performed at [facility], [address] [place], [place] [address]    Report Status PENDING  Incomplete     Labs: BNP (last 3 results)  Recent Labs  05/22/16 1932  BNP 58.5   Basic Metabolic Panel:  Recent Labs Lab 03/07/17 2210 03/08/17 0407 03/10/17 0347  NA 140 141 140  K 3.7 3.7 3.6  CL 110 114* 111  CO2 20* 18* 22  GLUCOSE 95 90 92  BUN 16 12 12   CREATININE 1.59* 1.23* 0.98  CALCIUM 9.1 7.7* 8.7*   Liver Function Tests: No results for input(s): AST, ALT, ALKPHOS, BILITOT, PROT, ALBUMIN in the last 168  hours. No results for input(s): LIPASE, AMYLASE in the last 168 hours. No results for input(s): AMMONIA in the last 168 hours. CBC:  Recent Labs Lab 03/07/17 2210 03/08/17 0407 03/10/17 0347  WBC 8.6 6.7 6.0  NEUTROABS 6.0  --  1.9  HGB 12.6 10.4* 9.4*  HCT 35.6* 30.3* 26.9*  MCV 78.9 80.6 78.2  PLT 120* 99* 131*   Cardiac Enzymes: No results for input(s): CKTOTAL, CKMB, CKMBINDEX, TROPONINI in the last 168 hours. BNP: Invalid input(s): POCBNP CBG: No results for input(s): GLUCAP in the last 168 hours. D-Dimer No results for input(s): DDIMER in the last 72 hours. Hgb A1c No results for input(s): HGBA1C in the last 72 hours. Lipid Profile No results for input(s): CHOL, HDL, LDLCALC, TRIG, CHOLHDL, LDLDIRECT in the last 72 hours. Thyroid function studies No results for input(s): TSH, T4TOTAL, T3FREE, THYROIDAB in the last 72 hours.  Invalid input(s): FREET3 Anemia work up No results for input(s): VITAMINB12, FOLATE, FERRITIN, TIBC, IRON, RETICCTPCT in the last 72 hours. Urinalysis    Component Value Date/Time   COLORURINE AMBER (A) 03/07/2017 2210   APPEARANCEUR HAZY (A) 03/07/2017 2210   LABSPEC 1.014 03/07/2017 2210   PHURINE 5.0 03/07/2017 2210   GLUCOSEU NEGATIVE 03/07/2017 2210   HGBUR MODERATE (A) 03/07/2017 2210   BILIRUBINUR NEGATIVE 03/07/2017 2210   KETONESUR NEGATIVE 03/07/2017 2210   PROTEINUR 100 (A) 03/07/2017 2210   NITRITE POSITIVE (A) 03/07/2017 2210   LEUKOCYTESUR SMALL (A) 03/07/2017 2210   Sepsis Labs Invalid input(s): PROCALCITONIN,  WBC,  LACTICIDVEN Microbiology Recent Results (from the past 240 hour(s))  Urine culture     Status: Abnormal   Collection Time: 03/02/17 10:15 PM  Result Value Ref Range Status   Specimen Description URINE, RANDOM  Final   Special Requests NONE  Final   Culture 60,000 COLONIES/mL ESCHERICHIA COLI (A)  Final   Report Status 03/05/2017 FINAL  Final   Organism ID, Bacteria ESCHERICHIA COLI (A)  Final       Susceptibility   Escherichia coli - MIC*    AMPICILLIN 4 SENSITIVE Sensitive     CEFAZOLIN <=4 SENSITIVE Sensitive     CEFTRIAXONE <=1 SENSITIVE Sensitive     CIPROFLOXACIN <=0.25 SENSITIVE Sensitive     GENTAMICIN <=1 SENSITIVE Sensitive     IMIPENEM <=0.25 SENSITIVE Sensitive     NITROFURANTOIN <=16 SENSITIVE Sensitive     TRIMETH/SULFA >=320 RESISTANT Resistant     AMPICILLIN/SULBACTAM <=2 SENSITIVE Sensitive     PIP/TAZO <=4 SENSITIVE Sensitive     Extended ESBL NEGATIVE Sensitive     * 60,000 COLONIES/mL ESCHERICHIA COLI  Culture, blood (Routine X 2) w Reflex to ID Panel     Status: None (Preliminary result)   Collection Time: 03/08/17  1:30 AM  Result Value Ref Range Status   Specimen Description BLOOD LEFT HAND  Final   Special Requests   Final    BOTTLES DRAWN AEROBIC AND ANAEROBIC Blood Culture adequate volume   Culture   Final    NO GROWTH 1 DAY Performed at [facility], [address] [place], [place] [address]    Report Status PENDING  Incomplete  Culture, blood (Routine X 2) w Reflex to ID Panel     Status: None (Preliminary result)   Collection Time: 03/08/17  1:30 AM  Result Value Ref Range Status   Specimen Description BLOOD LEFT ANTECUBITAL  Final   Special Requests   Final    BOTTLES DRAWN AEROBIC AND ANAEROBIC Blood Culture adequate volume   Culture   Final    NO GROWTH 1 DAY Performed at [facility], [address] [place], [place] [address]    Report Status PENDING  Incomplete     Time coordinating discharge: 25 minutes  SIGNED:  Latrelle Dodrill, MD  [facility] 03/10/2017, 2:10 PM  Pager please  text page via  [email or web address] Password [email or web address]

## 2017-03-11 LAB — URINE CULTURE: Culture: NO GROWTH

## 2017-03-13 LAB — CULTURE, BLOOD (ROUTINE X 2)
CULTURE: NO GROWTH
Culture: NO GROWTH
Special Requests: ADEQUATE
Special Requests: ADEQUATE

## 2017-05-20 ENCOUNTER — Inpatient Hospital Stay (HOSPITAL_COMMUNITY)
Admission: RE | Admit: 2017-05-20 | Discharge: 2017-05-25 | DRG: 885 | Disposition: A | Discharge status: Home / Self Care | Payer: Medicaid Other | Attending: Psychiatry | Admitting: Psychiatry

## 2017-05-20 DIAGNOSIS — Z9114 Patient's other noncompliance with medication regimen: Secondary | ICD-10-CM

## 2017-05-20 DIAGNOSIS — G4700 Insomnia, unspecified: Secondary | ICD-10-CM | POA: Diagnosis present

## 2017-05-20 DIAGNOSIS — F17290 Nicotine dependence, other tobacco product, uncomplicated: Secondary | ICD-10-CM | POA: Diagnosis present

## 2017-05-20 DIAGNOSIS — F1910 Other psychoactive substance abuse, uncomplicated: Secondary | ICD-10-CM | POA: Diagnosis not present

## 2017-05-20 DIAGNOSIS — R45851 Suicidal ideations: Secondary | ICD-10-CM | POA: Diagnosis present

## 2017-05-20 DIAGNOSIS — R4587 Impulsiveness: Secondary | ICD-10-CM | POA: Diagnosis not present

## 2017-05-20 DIAGNOSIS — R4584 Anhedonia: Secondary | ICD-10-CM | POA: Diagnosis not present

## 2017-05-20 DIAGNOSIS — Z811 Family history of alcohol abuse and dependence: Secondary | ICD-10-CM | POA: Diagnosis not present

## 2017-05-20 DIAGNOSIS — Z818 Family history of other mental and behavioral disorders: Secondary | ICD-10-CM | POA: Diagnosis not present

## 2017-05-20 DIAGNOSIS — Z6379 Other stressful life events affecting family and household: Secondary | ICD-10-CM | POA: Diagnosis not present

## 2017-05-20 DIAGNOSIS — J45909 Unspecified asthma, uncomplicated: Secondary | ICD-10-CM | POA: Diagnosis present

## 2017-05-20 DIAGNOSIS — R443 Hallucinations, unspecified: Secondary | ICD-10-CM | POA: Diagnosis not present

## 2017-05-20 DIAGNOSIS — Z638 Other specified problems related to primary support group: Secondary | ICD-10-CM | POA: Diagnosis not present

## 2017-05-20 DIAGNOSIS — F419 Anxiety disorder, unspecified: Secondary | ICD-10-CM | POA: Diagnosis present

## 2017-05-20 DIAGNOSIS — Z590 Homelessness: Secondary | ICD-10-CM | POA: Diagnosis not present

## 2017-05-20 DIAGNOSIS — F333 Major depressive disorder, recurrent, severe with psychotic symptoms: Secondary | ICD-10-CM | POA: Diagnosis present

## 2017-05-20 DIAGNOSIS — F17210 Nicotine dependence, cigarettes, uncomplicated: Secondary | ICD-10-CM | POA: Diagnosis not present

## 2017-05-20 DIAGNOSIS — R5383 Other fatigue: Secondary | ICD-10-CM | POA: Diagnosis not present

## 2017-05-20 MED ORDER — IBUPROFEN 600 MG PO TABS
600.0000 mg | ORAL_TABLET | Freq: Four times a day (QID) | ORAL | Status: DC | PRN
  Administered 2017-05-21 (×2): 600 mg via ORAL
  Filled 2017-05-20 (×2): qty 1

## 2017-05-20 MED ORDER — ZIPRASIDONE MESYLATE 20 MG IM SOLR: 20.0000 mg | INTRAMUSCULAR | Status: DC | PRN

## 2017-05-20 MED ORDER — LORAZEPAM 1 MG PO TABS: 1.0000 mg | ORAL_TABLET | ORAL | Status: DC | PRN

## 2017-05-20 MED ORDER — ALUM & MAG HYDROXIDE-SIMETH 200-200-20 MG/5ML PO SUSP: 30.0000 mL | ORAL | Status: DC | PRN

## 2017-05-20 MED ORDER — ACETAMINOPHEN 325 MG PO TABS
650.0000 mg | ORAL_TABLET | Freq: Four times a day (QID) | ORAL | Status: DC | PRN
  Administered 2017-05-21: 650 mg via ORAL
  Filled 2017-05-20: qty 2

## 2017-05-20 MED ORDER — ALBUTEROL SULFATE HFA 108 (90 BASE) MCG/ACT IN AERS
2.0000 | INHALATION_SPRAY | Freq: Four times a day (QID) | RESPIRATORY_TRACT | Status: DC | PRN
  Administered 2017-05-21 – 2017-05-25 (×9): 2 via RESPIRATORY_TRACT
  Filled 2017-05-20: qty 6.7

## 2017-05-20 MED ORDER — ARIPIPRAZOLE 5 MG PO TABS
5.0000 mg | ORAL_TABLET | Freq: Every day | ORAL | Status: DC
  Administered 2017-05-21: 5 mg via ORAL
  Filled 2017-05-20 (×3): qty 1

## 2017-05-20 MED ORDER — RISPERIDONE 2 MG PO TBDP
2.0000 mg | ORAL_TABLET | Freq: Three times a day (TID) | ORAL | Status: DC | PRN
  Administered 2017-05-21: 2 mg via ORAL
  Filled 2017-05-20: qty 2

## 2017-05-20 MED ORDER — TRAZODONE HCL 50 MG PO TABS
50.0000 mg | ORAL_TABLET | Freq: Every evening | ORAL | Status: DC | PRN
  Filled 2017-05-20 (×5): qty 1

## 2017-05-20 MED ORDER — MAGNESIUM HYDROXIDE 400 MG/5ML PO SUSP
30.0000 mL | Freq: Every day | ORAL | Status: DC | PRN
  Administered 2017-05-24: 30 mL via ORAL
  Filled 2017-05-20: qty 30

## 2017-05-20 MED ORDER — QUETIAPINE FUMARATE 50 MG PO TABS
50.0000 mg | ORAL_TABLET | Freq: Every day | ORAL | Status: DC
  Administered 2017-05-21: 50 mg via ORAL
  Filled 2017-05-20 (×4): qty 1

## 2017-05-20 MED ORDER — HYDROXYZINE HCL 25 MG PO TABS
25.0000 mg | ORAL_TABLET | Freq: Four times a day (QID) | ORAL | Status: DC | PRN
  Administered 2017-05-21 – 2017-05-24 (×4): 25 mg via ORAL
  Filled 2017-05-20 (×4): qty 1

## 2017-05-20 NOTE — [facility] Counselor (Signed)
Per Donell Sievert, PA-C: Patient meets criteria for inpatient treatment.  Per [facility], Tori, RN: Patient has been accepted to [phone].
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
## 2017-05-20 NOTE — H&P (Signed)
Behavioral Health Medical Screening Exam  Norma Moreno is an 36 y.o. female accompanied by BF, is endorsing worsening depressive symptoms I.e. Hopelessness, despair, and sadness commensurate with SI with plan, intent and means via death by MVC. Triggers include financial woes, living in transitional housing. She has been non compliant with medications x one month. Previous mediations include Cymbalta. She denies any drug allergies or significant co-morbid conditions. Last hospitalization was in 2006. She is also endorsing initial and mid-insomnia along with AVH commensurate with seeing spiders, shadows and intrusive thoughts with command AH, telling her to do bad things. Patient endorses a previous hx of opiate dependence, but has been off of opiates > 30 days.  Total Time spent with patient: 20 minutes  Psychiatric Specialty Exam: Physical Exam  Constitutional: She is oriented to person, place, and time. She appears well-developed and well-nourished. No distress.  HENT:  Head: Normocephalic.  Eyes: Pupils are equal, round, and reactive to light.  Respiratory: Effort normal and breath sounds normal. No respiratory distress. She has no wheezes.  Neurological: She is alert and oriented to person, place, and time. No cranial nerve deficit.  Skin: Skin is warm and dry. She is not diaphoretic.  Psychiatric: Judgment normal. Her mood appears anxious. Her speech is delayed. She is withdrawn. Cognition and memory are normal. She exhibits a depressed mood. She expresses suicidal ideation.    Review of Systems  Psychiatric/Behavioral: Positive for depression, hallucinations and suicidal ideas. Negative for substance abuse. The patient is nervous/anxious and has insomnia.   All other systems reviewed and are negative.   There were no vitals taken for this visit.There is no height or weight on file to calculate BMI.  General Appearance: Disheveled  Eye Contact:  Fair  Speech:  Blocked  Volume:   Decreased  Mood:  Depressed  Affect:  Congruent  Thought Process:  Goal Directed  Orientation:  Full (Time, Place, and Person)  Thought Content:  Hallucinations: Auditory Visual  Suicidal Thoughts:  Yes.  with intent/plan  Homicidal Thoughts:  Yes.  without intent/plan  Memory:  Immediate;   Fair  Judgement:  Impaired  Insight:  Lacking  Psychomotor Activity:  Decreased  Concentration: Concentration: Poor  Recall:  [employer] of Knowledge:Fair  Language: Fair  Akathisia:  Negative  Handed:  Right  AIMS (if indicated):     Assets:  Desire for Improvement  Sleep:       Musculoskeletal: Strength & Muscle Tone: within normal limits Gait & Station: normal Patient leans: N/A  There were no vitals taken for this visit.  Recommendations:  Based on my evaluation the patient does not appear to have an emergency medical condition.  Kerry Hough, PA-C 05/20/2017, 10:53 PM

## 2017-05-21 ENCOUNTER — Encounter (HOSPITAL_COMMUNITY): Payer: Self-pay | Admitting: *Deleted

## 2017-05-21 DIAGNOSIS — R4587 Impulsiveness: Secondary | ICD-10-CM

## 2017-05-21 DIAGNOSIS — R45851 Suicidal ideations: Secondary | ICD-10-CM

## 2017-05-21 DIAGNOSIS — Z590 Homelessness: Secondary | ICD-10-CM

## 2017-05-21 DIAGNOSIS — Z818 Family history of other mental and behavioral disorders: Secondary | ICD-10-CM

## 2017-05-21 DIAGNOSIS — F17210 Nicotine dependence, cigarettes, uncomplicated: Secondary | ICD-10-CM

## 2017-05-21 DIAGNOSIS — Z811 Family history of alcohol abuse and dependence: Secondary | ICD-10-CM

## 2017-05-21 DIAGNOSIS — R4584 Anhedonia: Secondary | ICD-10-CM

## 2017-05-21 DIAGNOSIS — F333 Major depressive disorder, recurrent, severe with psychotic symptoms: Principal | ICD-10-CM

## 2017-05-21 DIAGNOSIS — Z638 Other specified problems related to primary support group: Secondary | ICD-10-CM

## 2017-05-21 DIAGNOSIS — R443 Hallucinations, unspecified: Secondary | ICD-10-CM

## 2017-05-21 DIAGNOSIS — R5383 Other fatigue: Secondary | ICD-10-CM

## 2017-05-21 LAB — URINALYSIS, ROUTINE W REFLEX MICROSCOPIC
BILIRUBIN URINE: NEGATIVE
Bacteria, UA: NONE SEEN
GLUCOSE, UA: NEGATIVE mg/dL
Hgb urine dipstick: NEGATIVE
Ketones, ur: NEGATIVE mg/dL
NITRITE: NEGATIVE
PH: 6 (ref 5.0–8.0)
Protein, ur: NEGATIVE mg/dL
Specific Gravity, Urine: 1.008 (ref 1.005–1.030)

## 2017-05-21 LAB — RAPID URINE DRUG SCREEN, HOSP PERFORMED
Amphetamines: NOT DETECTED
BARBITURATES: NOT DETECTED
Benzodiazepines: NOT DETECTED
Cocaine: NOT DETECTED
Opiates: NOT DETECTED
TETRAHYDROCANNABINOL: NOT DETECTED

## 2017-05-21 LAB — PREGNANCY, URINE: Preg Test, Ur: NEGATIVE

## 2017-05-21 MED ORDER — BENZOCAINE 10 % MT GEL
OROMUCOSAL | Status: DC | PRN
  Administered 2017-05-21: 22:00:00 via OROMUCOSAL
  Filled 2017-05-21: qty 9.4

## 2017-05-21 MED ORDER — ARIPIPRAZOLE 5 MG PO TABS
5.0000 mg | ORAL_TABLET | Freq: Two times a day (BID) | ORAL | Status: DC
  Filled 2017-05-21 (×6): qty 1

## 2017-05-21 MED ORDER — CITALOPRAM HYDROBROMIDE 10 MG PO TABS
10.0000 mg | ORAL_TABLET | Freq: Every day | ORAL | Status: DC
  Administered 2017-05-22 – 2017-05-25 (×4): 10 mg via ORAL
  Filled 2017-05-21 (×6): qty 1

## 2017-05-21 MED ORDER — TRAZODONE HCL 50 MG PO TABS
50.0000 mg | ORAL_TABLET | Freq: Every evening | ORAL | Status: DC | PRN
  Filled 2017-05-21: qty 1

## 2017-05-21 NOTE — Plan of Care (Signed)
Problem: Safety: Goal: Periods of time without injury will increase Outcome: Progressing Pt remains a low fall risk, endorses passive SI but verbal contracts for safety.    

## 2017-05-21 NOTE — Progress Notes (Signed)
  DATA ACTION RESPONSE  Objective- Pt. is visible in the room, seen getting organized. Presents with a depressed/anxious affect and mood. Was minimal with interaction.  Subjective- Denies HI. Rates pain 10/10; generalized. Endorses passive SI but verbal contracts for safety.+AVH stating "I see spiders and hears random voices telling me to hurt myself". Is cooperative and oriented to the unit.  1:1 interaction in private to establish rapport. Encouragement, education, & support given from staff.  PRN Ibuprofen and vistaril requested and will re-eval accordingly.   Safety maintained with Q 15 checks. Continue with POC.

## 2017-05-21 NOTE — Tx Team (Signed)
Initial Treatment Plan 05/21/2017 12:33 AM Phineas Semen ZOX:[id number]    PATIENT STRESSORS: Financial difficulties Other: living situation   PATIENT STRENGTHS: Ability for insight Average or above average intelligence Capable of independent living General fund of knowledge   PATIENT IDENTIFIED PROBLEMS: Depression Auditory hallucinations Suicidal thoughts "find a safe and stable place for me and my kids"                     DISCHARGE CRITERIA:  Ability to meet basic life and health needs Improved stabilization in mood, thinking, and/or behavior Verbal commitment to aftercare and medication compliance  PRELIMINARY DISCHARGE PLAN: Attend aftercare/continuing care group Placement in alternative living arrangements  PATIENT/FAMILY INVOLVEMENT: This treatment plan has been presented to and reviewed with the patient, Norma Moreno, and/or family member, .  The patient and family have been given the opportunity to ask questions and make suggestions.  Norma Moreno, [place], [place] 05/21/2017, 12:33 AM

## 2017-05-21 NOTE — BHH Suicide Risk Assessment (Signed)
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 Schizo-affective schizophrenia, chronic condition with acute exacerbation (HCC) [F25.8] 05/20/2017  . Acute pyelonephritis [N10] 03/08/2017  . AKI (acute kidney injury) (HCC) [N17.9] 03/08/2017  . Thrombocytopenia (HCC) [D69.6] 03/08/2017  . Diarrhea [R19.7] 03/08/2017  . Abdominal pain [R10.9] 03/08/2017  . Pyelonephritis [N12] 03/08/2017  . Asthma [J45.909]   . Tobacco abuse [Z72.0]     Continued Clinical Symptoms:  Alcohol Use Disorder Identification Test Final Score (AUDIT): 0 The "Alcohol Use Disorders Identification Test", Guidelines for Use in Primary Care, Second Edition.  World Science writer [facility]). Score between 0-7:  no or low risk or alcohol related problems. Score between 8-15:  moderate risk of alcohol related problems. Score between 16-19:  high risk of alcohol related problems. Score 20 or above:  warrants further diagnostic evaluation for alcohol dependence and treatment.   CLINICAL FACTORS:  36 year old female, presents worsening depression, and also endorses psychotic symptoms ( both visual or auditory hallucinations ) . She was recently evicted, resulting in worsening depression. Describes suicidal ideations , but contracts for safety on unit, and describes vague homicidal ideations but does not specify.   Psychiatric Specialty Exam: Physical Exam  ROS  Blood pressure (!) 107/59, pulse 90, temperature 98.3 F (36.8 C),  temperature source Oral, resp. rate 16, height  (1.626 m), weight 70.3 kg (155 lb).Body mass index is 26.61 kg/m.   see admission MSE      COGNITIVE FEATURES THAT CONTRIBUTE TO RISK:  Closed-mindedness and Loss of executive function    SUICIDE RISK:   Moderate:  Frequent suicidal ideation with limited intensity, and duration, some specificity in terms of plans, no associated intent, good self-control, limited dysphoria/symptomatology, some risk factors present, and identifiable protective factors, including available and accessible social support.  PLAN OF CARE: Patient will be admitted to inpatient psychiatric unit for stabilization and safety. Will provide and encourage milieu participation. Provide medication management and maked adjustments as needed.  Will follow daily.    I certify that inpatient services furnished can reasonably be expected to improve the patient's condition.   Craige Cotta, MD 05/21/2017, 9:05 AM

## 2017-05-21 NOTE — Progress Notes (Signed)
D: Pt +ve SI/HI/AVH- contracts for safety. Pt is pleasant and cooperative. Pt stated she was concerned about her kids and the environment they are in because it is very dangerous, pt stated she was refusing the Abilify because it was making her sleepy,pt was educated on the Abilify and pt agreed to try it at night or stated she was willing to try something else that did not make her sleepy. Pt kept to herself in her room most of the evening, but pt would talk when engaged, pt was a little agitated and pt was given PRN Risperdal and informed that it could possibly help her with the voices also.    A: Pt was offered support and encouragement.  Pt was encourage to attend groups. Q 15 minute checks were done for safety.   R:Pt attends groups and interacts well with peers and staff. Pt is taking medication. Pt receptive to treatment and safety maintained on unit.

## 2017-05-21 NOTE — Progress Notes (Signed)
Norma Moreno is a 36 year old female pt admitted on voluntary basis. [name] presented as a walk-in and reports on-going depression with suicidal thoughts as well as auditory hallucinations. [name] does endorse SI on admission but is able to contract for safety while in the hospital. [name] reports living in transitional housing but reports that she is unsure of where she will go after discharge and reports that she needs help finding a safe place for her and her children to go to. [name] does appear flat and depressed during admission process, she was oriented to the unit and safety maintained.

## 2017-05-21 NOTE — Progress Notes (Signed)
Report received from [place], [place].

## 2017-05-21 NOTE — Progress Notes (Signed)
Patient did not attend group.

## 2017-05-21 NOTE — [facility] Counselor (Signed)
Adult Comprehensive Assessment  Patient ID: Norma Moreno, female   DOB: [birth year], 36 y.o.   MRN: [id number]  Information Source: Information source: Patient  Current Stressors:  Educational / Learning stressors: 11th grade education  Employment / Job issues: Currently unemployed  Family Relationships: Distant relationships with some family members  Surveyor, quantity / Lack of resources (include bankruptcy): No income  Housing / Lack of housing: Currently homeless- got evicted from transititional housing prior to admission  Physical health (include injuries & life threatening diseases): None reported  Social relationships: Few social supports  Substance abuse: 34 days of sobriety from narcotics, opiate abuse, denies alcohol use  Bereavement / Loss: None reported   Living/Environment/Situation:  Living Arrangements: Other (Comment) Doctor, general practice Housing ) Living conditions (as described by patient or guardian): Pt was staying in transistional housing but was recently evicted; pt will be homeless upon discharge unless she finds housing while in the hospital    How long has patient lived in current situation?: About a month  What is atmosphere in current home: Temporary  Family History:  Marital status: Long term relationship Long term relationship, how long?: 9 mo  What types of issues is patient dealing with in the relationship?: Pt states that she isn't dealing with any issues in her relationship  Does patient have children?: Yes How many children?: 5 (Ranging from ages 59-13 yo) How is patient's relationship with their children?: Pt states that she has a close relationship with her children, they are staying with her mother while pt is in the hospital    Childhood History:  By whom was/is the patient raised?: Both parents, Other (Comment) Additional childhood history information: Pt was raised "back and forth" between her parents and her godmother  Description of patient's relationship  with caregiver when they were a child: Pt describes her childhood as "kind of hard". She states that she had an okay relationship with her mom and dad growing up  Patient's description of current relationship with people who raised him/her: Pt states that she does not have a close relationship with her mother currently but her mother occasionally helps her with her kids. Pt's parents are still married and she has contact with her dad only occasionally too. Does patient have siblings?: Yes Number of Siblings: 7 (3 brothers, 4 sisters ) Description of patient's current relationship with siblings: Pt states that she is close with some of her siblings  Did patient suffer any verbal/emotional/physical/sexual abuse as a child?: No Did patient suffer from severe childhood neglect?: No Has patient ever been sexually abused/assaulted/raped as an adolescent or adult?: No Was the patient ever a victim of a crime or a disaster?: No Witnessed domestic violence?: No Has patient been effected by domestic violence as an adult?: No  Education:  Highest grade of school patient has completed: 11th Name of school: N/A Contact person: N/A  Employment/Work Situation:   Employment situation: Unemployed Has patient ever been in the [employer]?: No Has patient ever served in Buyer, retail?: No Did You Receive Any Psychiatric Treatment/Services While in Equities trader?:  (NA) Are There Guns or Other Weapons in Your Home?: No Are These Comptroller?:  ( NA)  Financial Resources:   Financial resources: Medicaid  Alcohol/Substance Abuse:   What has been your use of drugs/alcohol within the last 12 months?: Opiate use daily, past narcotic abuse but has been sober for 34 days, denies alcohol use  Alcohol/Substance Abuse Treatment Hx: Attends AA/NA, Denies past history Has alcohol/substance abuse ever caused  legal problems?: No  Social Support System:   Patient's Community Support System: Poor Describe Community  Support System: Pt states thats her boyfriend is her main support  Type of faith/religion: Christian  How does patient's faith help to cope with current illness?: Prayer   Leisure/Recreation:   Leisure and Hobbies: "I don't know"  Strengths/Needs:   What things does the patient do well?: "I don't know" In what areas does patient struggle / problems for patient: Depression and AVH  Discharge Plan:   Does patient have access to transportation?: Yes (Public transportation ) Will patient be returning to same living situation after discharge?: No Plan for living situation after discharge: Pt is unable to return to transtitiional housing. Pt is hopeful that she will be able to find resources for housing while she's here  Currently receiving community mental health services: Yes (From Whom) Vesta Mixer) Does patient have financial barriers related to discharge medications?: Yes Patient description of barriers related to discharge medications: No income and no insurance   Summary/Recommendations:     Patient is a 36 yo female who presented to the hospital with depression and SI. Pt's primary diagnosis is Schizoaffective Disorder, Depressed. Pt has 5 kids all 52 yo and younger. Pt's kids are being cared for by her mother while pt is in the hospital. Primary triggers for admission include homelessness after being evicted from transitional housing, and increasing depression. Pt states that she has been struggling with depression for years and has also been struggling with AVH. During the time of the assessment pt was alert and oriented, and forthcoming with information. Pt avoided eye contact during the assessment and sat with her head hung low. Pt's affect was depressed and pt was tearful at times. Pt currently endorses HI towards people that she refuses to name and states that they are the reason she got evicted from transistional housing. Pt is agreeable to [facility] for outpatient services. Pt states that  her boyfriend is her main support at this time. Patient will benefit from crisis stabilization, medication evaluation, group therapy and pyschoeducation, in addition to case management for discharge planning. At discharge, it is recommended that pt remain compliant with the established discharge plan and continue treatment.  Jonathon Jordan, MSW, Theresia Majors  05/21/2017

## 2017-05-21 NOTE — H&P (Addendum)
Psychiatric Admission Assessment Adult  Patient Identification: Norma Moreno MRN:  [id number] Date of Evaluation:  05/21/2017 Chief Complaint:  " very depressed, hallucinating" Principal Diagnosis: Schizoaffective Disorder, Depressed  Versus MDD , with Psychotic Symptoms Diagnosis:   Patient Active Problem List   Diagnosis Date Noted  . Schizo-affective schizophrenia, chronic condition with acute exacerbation (HCC) [F25.8] 05/20/2017  . Acute pyelonephritis [N10] 03/08/2017  . AKI (acute kidney injury) (HCC) [N17.9] 03/08/2017  . Thrombocytopenia (HCC) [D69.6] 03/08/2017  . Diarrhea [R19.7] 03/08/2017  . Abdominal pain [R10.9] 03/08/2017  . Pyelonephritis [N12] 03/08/2017  . Asthma [J45.909]   . Tobacco abuse [Z72.0]    History of Present Illness: Patient reports worsening depression and sadness, and states she has been depressed for " months, years ". She reports worsening depression recently, and has been facing some significant psychosocial stressors- limited support system, recently being evicted from transitional housing , leading her to be homeless at this time.  She presented to ED voluntarily , reporting severe depression, suicidal ideations of walking in front of a truck or of overdosing , and also reported hallucinations. She states she has been " seeing spiders, seeing shadows", and endorses auditory hallucinations " they say different stuff, say things like " don't listen to him". She states she does have hallucinations telling her to kill herself .  She describes history of chronic suicidal ideations, which are intermittent " come and go" but have been worse recently.  Also reports homicidal ideations towards " a group of people who live around where I did" but does not specify .   Associated Signs/Symptoms: Depression Symptoms:  depressed mood, anhedonia, insomnia, suicidal thoughts with specific plan, loss of energy/fatigue, decreased appetite, (Hypo) Manic Symptoms:   None  Anxiety Symptoms:  Reports increased anxiety and some panic attacks  Psychotic Symptoms:  Reports visual and auditory hallucinations, but does not appear internally preoccupied at this time PTSD Symptoms: Denies  Total Time spent with patient: 45 minutes  Past Psychiatric History: reports prior history of suicidal attempts, last time 2004 ( walked into traffic ) . States she has never been in a psychiatric hospital before . Reports long history of depression, and history of hallucinations,which occur more often when depressed but also when not feeling as depressed. States she has been diagnosed with " severe depression" in the past   Is the patient at risk to self? Yes.    Has the patient been a risk to self in the past 6 months? Yes.    Has the patient been a risk to self within the distant past? Yes.    Is the patient a risk to others? No.  Has the patient been a risk to others in the past 6 months? No.  Has the patient been a risk to others within the distant past? No.   Prior Inpatient Therapy: Prior Inpatient Therapy: Yes Prior Therapy Dates: 2006 Prior Therapy Facilty/Provider(s): Pt. reports a facility in [place] Reason for Treatment: Depression, SI Prior Outpatient Therapy: Prior Outpatient Therapy: Yes Prior Therapy Dates: Ongoing Prior Therapy Facilty/Provider(s): [facility] Reason for Treatment: Depression and SI Does patient have an ACCT team?: No Does patient have Intensive In-House Services?  : No Does patient have [facility] services? : Yes Does patient have P4CC services?: No  Alcohol Screening: 1. How often do you have a drink containing alcohol?: Never 9. Have you or someone else been injured as a result of your drinking?: No 10. Has a relative or friend or  a doctor or another health worker been concerned about your drinking or suggested you cut down?: No Alcohol Use Disorder Identification Test Final Score (AUDIT): 0 Brief  Intervention: AUDIT score less than 7 or less-screening does not suggest unhealthy drinking-brief intervention not indicated Substance Abuse History in the last 12 months:   She reports history of opiate abuse ( percocet, oxycontin). She states she stopped using opiates 30 + days ago because " I was really tired of using ", denies alcohol or other drug abuse   Consequences of Substance Abuse: Denies  Previous Psychotropic Medications: States she had been taking Cymbalta for " a couple of months" and another medication , she cannot remember name of . States she does not think Cymbalta was helping . She has been on Xanax in the past but not recently .  Psychological Evaluations:  No  Past Medical History: reports history of asthma. NKDA, Smokes 1 PPD  Past Medical History:  Diagnosis Date  . Arthritis   . Asthma   . Back pain   . Tobacco abuse     Past Surgical History:  Procedure Laterality Date  . CESAREAN SECTION     x 4  . CHOLECYSTECTOMY     Family History: has three brothers and four sisters , parents alive, live together.  Family History  Problem Relation Age of Onset  . Kidney Stones Father    Family Psychiatric  History:  States there is a history of depression in family, states brother and sister have history of " depression and bipolar ", no history of suicides in the family, one brother is alcoholic . Tobacco Screening: Have you used any form of tobacco in the last 30 days? (Cigarettes, Smokeless Tobacco, Cigars, and/or Pipes): Yes Tobacco use, Select all that apply: 5 or more cigarettes per day Are you interested in Tobacco Cessation Medications?: Yes, will notify MD for an order Counseled patient on smoking cessation including recognizing danger situations, developing coping skills and basic information about quitting provided: Refused/Declined practical counseling Social History: single, has 5 children ( ranging in ages from 47 to 21 years old ) , had been living in a  transitional home , states she was evicted just prior to admission, receives " a death benefit from my oldest son's father", not on disability, no legal issues . She states she has a BF who is supportive. Her children are currently with patient's mother. History  Alcohol Use No     History  Drug Use No    Additional Social History: Marital status: Single    Pain Medications: See MAR Prescriptions: See MAR Over the Counter: See MAR History of alcohol / drug use?: Yes Longest period of sobriety (when/how long): 33 days (current) Name of Substance 1: Opiates 1 - Age of First Use: Unknown 1 - Amount (size/oz): N/A 1 - Frequency: N/A 1 - Duration: N/A 1 - Last Use / Amount: Patient reports currently being sober for 33 days at time of assessment.  Allergies:  No Known Allergies Lab Results: No results found for this or any previous visit (from the past 48 hour(s)).  Blood Alcohol level:  No results found for: [facility]  Metabolic Disorder Labs:  No results found for: HGBA1C, MPG No results found for: PROLACTIN No results found for: CHOL, TRIG, HDL, CHOLHDL, VLDL, LDLCALC  Current Medications: Current Facility-Administered Medications  Medication Dose Route Frequency Provider Last Rate Last Dose  . acetaminophen (TYLENOL) tablet 650 mg  650 mg Oral Q6H PRN Melvenia Beam,  Spencer E, PA-C      . albuterol (PROVENTIL HFA;VENTOLIN HFA) 108 (90 Base) MCG/ACT inhaler 2 puff  2 puff Inhalation Q6H PRN Donell Sievert E, PA-C   2 puff at 05/21/17 0022  . alum & mag hydroxide-simeth (MAALOX/MYLANTA) 200-200-20 MG/5ML suspension 30 mL  30 mL Oral Q4H PRN Donell Sievert E, PA-C      . ARIPiprazole (ABILIFY) tablet 5 mg  5 mg Oral Daily Donell Sievert E, PA-C   5 mg at 05/21/17 0830  . hydrOXYzine (ATARAX/VISTARIL) tablet 25 mg  25 mg Oral Q6H PRN Donell Sievert E, PA-C   25 mg at 05/21/17 0022  . ibuprofen (ADVIL,MOTRIN) tablet 600 mg  600 mg Oral Q6H PRN Kerry Hough, PA-C   600 mg at 05/21/17 0022  .  risperiDONE (RISPERDAL M-TABS) disintegrating tablet 2 mg  2 mg Oral Q8H PRN Kerry Hough, PA-C       And  . LORazepam (ATIVAN) tablet 1 mg  1 mg Oral PRN Donell Sievert E, PA-C       And  . ziprasidone (GEODON) injection 20 mg  20 mg Intramuscular PRN Donell Sievert E, PA-C      . magnesium hydroxide (MILK OF MAGNESIA) suspension 30 mL  30 mL Oral Daily PRN Kerry Hough, PA-C      . QUEtiapine (SEROQUEL) tablet 50 mg  50 mg Oral QHS Donell Sievert E, PA-C   50 mg at 05/21/17 0022  . traZODone (DESYREL) tablet 50 mg  50 mg Oral QHS,MR X 1 Simon, Spencer E, PA-C       PTA Medications: Prescriptions Prior to Admission  Medication Sig Dispense Refill Last Dose  . aspirin-acetaminophen-caffeine (EXCEDRIN MIGRAINE) 250-250-65 MG tablet Take 2 tablets by mouth every 8 (eight) hours as needed for headache or migraine. 30 tablet 0     Musculoskeletal: Strength & Muscle Tone: within normal limits Gait & Station: normal Patient leans: N/A  Psychiatric Specialty Exam: Physical Exam  Review of Systems  Constitutional: Negative.   Eyes: Negative.   Respiratory: Negative.   Cardiovascular: Negative.   Gastrointestinal: Negative.   Genitourinary: Negative.   Musculoskeletal: Negative.   Skin: Negative.   Neurological: Negative for seizures.  Endo/Heme/Allergies: Negative.   Psychiatric/Behavioral: Positive for depression, hallucinations and suicidal ideas.    Blood pressure (!) 107/59, pulse 90, temperature 98.3 F (36.8 C), temperature source Oral, resp. rate 16, height  (1.626 m), weight 70.3 kg (155 lb).Body mass index is 26.61 kg/m.  General Appearance: Fairly Groomed  Eye Contact:  Minimal  Speech:  Normal Rate  Volume:  Decreased  Mood:  depressed   Affect:  constricted, tearful  Thought Process:  Linear and Descriptions of Associations: Intact  Orientation:  Other:  fully alert and attentive  Thought Content:  reports auditory and visual hallucinations, does not  present internally preoccupied at this time  Suicidal Thoughts:  Yes.  without intent/plan denies plan or intention of hurting self or active suicidal ideations at this time, and is able to contract for safety on unit, identifies love for children and BF as protective factors against suicide  Homicidal Thoughts:  reports vague homicidal ideations towards " some people " who live near her, but does not specify further at this time  Memory:  recent and remote grossly intact   Judgement:  Fair  Insight:  Fair  Psychomotor Activity:  Decreased  Concentration:  Concentration: Fair and Attention Span: Fair  Recall:  [employer] of Knowledge:  Fair  Language:  Good  Akathisia:  No  Handed:  Right  AIMS (if indicated):     Assets:  Communication Skills Desire for Improvement  ADL's:  Intact  Cognition:  WNL  Sleep:  Number of Hours: 4.25    Treatment Plan Summary: Daily contact with patient to assess and evaluate symptoms and progress in treatment, Medication management, Plan inpatient admission, medications as below  and medications as below  Observation Level/Precautions:  15 minute checks  Laboratory:  as needed   Psychotherapy:  Milieu, group therapy   Medications:  Agrees to CELEXA trial, start 10 mgrs AM, ABILIFY 5  mgrs BID for depression and psychosis, D/C Seroquel ( for now will attempt antipsychotic monotherapy with Abilify)   Consultations:  As needed   Discharge Concerns:  -  Estimated LOS: 6 days   Other:     Physician Treatment Plan for Primary Diagnosis: Major Depression with Psychotic Features, 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 Disorder  Long Term Goal(s): Improvement in symptoms so as ready for discharge  Short Term Goals: Ability to identify changes in lifestyle to reduce recurrence of condition will improve and Ability to maintain clinical measurements within normal limits will improve  Physician Treatment Plan for Secondary Diagnosis: As above  Long Term Goal(s): Improvement in  symptoms so as ready for discharge  Short Term Goals: Ability to verbalize feelings will improve, Ability to disclose and discuss suicidal ideas, Ability to demonstrate self-control will improve, Ability to identify and develop effective coping behaviors will improve and Ability to maintain clinical measurements within normal limits will improve  I certify that inpatient services furnished can reasonably be expected to improve the patient's condition.    Craige Cotta, MD 9/27/20189:05 AM

## 2017-05-21 NOTE — Plan of Care (Signed)
Problem: Safety: Goal: Periods of time without injury will increase Outcome: Progressing Pt safe on the unit at this time  Problem: Education: Goal: Knowledge of the prescribed therapeutic regimen will improve Outcome: Progressing Pt stated she did not want to take the Abilify because it made her sleepy, but pt stated she would be willing to try it at night or try something else

## 2017-05-21 NOTE — Progress Notes (Signed)
Urine cup given. Pending sample.

## 2017-05-21 NOTE — [facility] Group Notes (Signed)
LCSW Group Therapy Note  05/21/2017 1:15pm  Type of Therapy/Topic:  Group Therapy:  Bal[facility] in Life  Participation Level:  Pt invited. Did not attend.  Jonathon Jordan, MSW, LCSWA 05/21/2017 3:45 PM

## 2017-05-22 LAB — COMPREHENSIVE METABOLIC PANEL
ALBUMIN: 3.2 g/dL — AB (ref 3.5–5.0)
ALT: 9 U/L — ABNORMAL LOW (ref 14–54)
ANION GAP: 5 (ref 5–15)
AST: 14 U/L — AB (ref 15–41)
Alkaline Phosphatase: 50 U/L (ref 38–126)
BUN: 8 mg/dL (ref 6–20)
CHLORIDE: 111 mmol/L (ref 101–111)
CO2: 25 mmol/L (ref 22–32)
Calcium: 8.4 mg/dL — ABNORMAL LOW (ref 8.9–10.3)
Creatinine, Ser: 1.03 mg/dL — ABNORMAL HIGH (ref 0.44–1.00)
GFR calc Af Amer: >60 mL/min (ref >60)
GFR calc non Af Amer: >60 mL/min (ref >60)
GLUCOSE: 84 mg/dL (ref 65–99)
Potassium: 4 mmol/L (ref 3.5–5.1)
SODIUM: 141 mmol/L (ref 135–145)
TOTAL PROTEIN: 6.4 g/dL — AB (ref 6.5–8.1)
Total Bilirubin: 0.3 mg/dL (ref 0.3–1.2)

## 2017-05-22 LAB — TSH: TSH: 2.348 u[IU]/mL (ref 0.350–4.500)

## 2017-05-22 LAB — HEMOGLOBIN A1C
HEMOGLOBIN A1C: 5.1 % (ref 4.8–5.6)
Mean Plasma Glucose: 99.67 mg/dL

## 2017-05-22 LAB — CBC
HCT: 31.2 % — ABNORMAL LOW (ref 36.0–46.0)
Hemoglobin: 10.6 g/dL — ABNORMAL LOW (ref 12.0–15.0)
MCH: 27.7 pg (ref 26.0–34.0)
MCHC: 34 g/dL (ref 30.0–36.0)
MCV: 81.7 fL (ref 78.0–100.0)
PLATELETS: 155 10*3/uL (ref 150–400)
RBC: 3.82 MIL/uL — AB (ref 3.87–5.11)
RDW: 15.3 % (ref 11.5–15.5)
WBC: 4.8 10*3/uL (ref 4.0–10.5)

## 2017-05-22 LAB — LIPID PANEL
Cholesterol: 115 mg/dL (ref 0–200)
HDL: 39 mg/dL — ABNORMAL LOW (ref >40)
LDL CALC: 55 mg/dL (ref 0–99)
Total CHOL/HDL Ratio: 2.9 RATIO
Triglycerides: 103 mg/dL (ref <150)
VLDL: 21 mg/dL (ref 0–40)

## 2017-05-22 MED ORDER — ARIPIPRAZOLE 15 MG PO TABS
7.5000 mg | ORAL_TABLET | Freq: Every day | ORAL | Status: DC
  Administered 2017-05-23 – 2017-05-24 (×2): 7.5 mg via ORAL
  Filled 2017-05-22 (×4): qty 1

## 2017-05-22 NOTE — Tx Team (Signed)
Interdisciplinary Treatment and Diagnostic Plan Update  05/22/2017 Time of Session: 8:08 AM  Norma Moreno MRN: [id number]  Principal Diagnosis: Schizoaffective Disorder, Depressed  Versus MDD , with Psychotic Symptoms  Secondary Diagnoses: Active Problems:   Schizo-affective schizophrenia, chronic condition with acute exacerbation ([place])   Current Medications:  Current Facility-Administered Medications  Medication Dose Route Frequency Provider Last Rate Last Dose  . acetaminophen (TYLENOL) tablet 650 mg  650 mg Oral Q6H PRN Laverle Hobby, PA-C   650 mg at 05/21/17 2009  . albuterol (PROVENTIL HFA;VENTOLIN HFA) 108 (90 Base) MCG/ACT inhaler 2 puff  2 puff Inhalation Q6H PRN Patriciaann Clan E, PA-C   2 puff at 05/22/17 0804  . alum & mag hydroxide-simeth (MAALOX/MYLANTA) 200-200-20 MG/5ML suspension 30 mL  30 mL Oral Q4H PRN Patriciaann Clan E, PA-C      . ARIPiprazole (ABILIFY) tablet 5 mg  5 mg Oral BID Cobos, Fernando A, MD      . benzocaine (ORAJEL) 10 % mucosal gel   Mouth/Throat PRN Lindon Romp A, NP      . citalopram (CELEXA) tablet 10 mg  10 mg Oral Daily Cobos, Myer Peer, MD   10 mg at 05/22/17 0803  . hydrOXYzine (ATARAX/VISTARIL) tablet 25 mg  25 mg Oral Q6H PRN Laverle Hobby, PA-C   25 mg at 05/21/17 2144  . ibuprofen (ADVIL,MOTRIN) tablet 600 mg  600 mg Oral Q6H PRN Laverle Hobby, PA-C   600 mg at 05/21/17 1549  . risperiDONE (RISPERDAL M-TABS) disintegrating tablet 2 mg  2 mg Oral Q8H PRN Laverle Hobby, PA-C   2 mg at 05/21/17 2144   And  . LORazepam (ATIVAN) tablet 1 mg  1 mg Oral PRN Patriciaann Clan E, PA-C       And  . ziprasidone (GEODON) injection 20 mg  20 mg Intramuscular PRN Patriciaann Clan E, PA-C      . magnesium hydroxide (MILK OF MAGNESIA) suspension 30 mL  30 mL Oral Daily PRN Laverle Hobby, PA-C      . traZODone (DESYREL) tablet 50 mg  50 mg Oral QHS PRN Cobos, Myer Peer, MD        PTA Medications: Prescriptions Prior to Admission  Medication  Sig Dispense Refill Last Dose  . aspirin-acetaminophen-caffeine (EXCEDRIN MIGRAINE) 250-250-65 MG tablet Take 2 tablets by mouth every 8 (eight) hours as needed for headache or migraine. 30 tablet 0     Patient Stressors: Financial difficulties Other: living situation  Patient Strengths: Ability for insight Average or above average intelligence Capable of independent living General fund of knowledge  Treatment Modalities: Medication Management, Group therapy, Case management,  1 to 1 session with clinician, Psychoeducation, Recreational therapy.   Physician Treatment Plan for Primary Diagnosis: <principal problem not specified> Long Term Goal(s): Improvement in symptoms so as ready for discharge  Short Term Goals: Ability to identify changes in lifestyle to reduce recurrence of condition will improve Ability to maintain clinical measurements within normal limits will improve Ability to verbalize feelings will improve Ability to disclose and discuss suicidal ideas Ability to demonstrate self-control will improve Ability to identify and develop effective coping behaviors will improve Ability to maintain clinical measurements within normal limits will improve  Medication Management: Evaluate patient's response, side effects, and tolerance of medication regimen.  Therapeutic Interventions: 1 to 1 sessions, Unit Group sessions and Medication administration.  Evaluation of Outcomes: Progressing  Physician Treatment Plan for Secondary Diagnosis: Active Problems:   Schizo-affective schizophrenia, chronic condition with  acute exacerbation ([place])   Long Term Goal(s): Improvement in symptoms so as ready for discharge  Short Term Goals: Ability to identify changes in lifestyle to reduce recurrence of condition will improve Ability to maintain clinical measurements within normal limits will improve Ability to verbalize feelings will improve Ability to disclose and discuss suicidal  ideas Ability to demonstrate self-control will improve Ability to identify and develop effective coping behaviors will improve Ability to maintain clinical measurements within normal limits will improve  Medication Management: Evaluate patient's response, side effects, and tolerance of medication regimen.  Therapeutic Interventions: 1 to 1 sessions, Unit Group sessions and Medication administration.  Evaluation of Outcomes: Progressing   RN Treatment Plan for Primary Diagnosis: <principal problem not specified> Long Term Goal(s): Knowledge of disease and therapeutic regimen to maintain health will improve  Short Term Goals: Ability to identify and develop effective coping behaviors will improve and Compliance with prescribed medications will improve  Medication Management: RN will administer medications as ordered by provider, will assess and evaluate patient's response and provide education to patient for prescribed medication. RN will report any adverse and/or side effects to prescribing provider.  Therapeutic Interventions: 1 on 1 counseling sessions, Psychoeducation, Medication administration, Evaluate responses to treatment, Monitor vital signs and CBGs as ordered, Perform/monitor CIWA, COWS, AIMS and Fall Risk screenings as ordered, Perform wound care treatments as ordered.  Evaluation of Outcomes: Progressing   LCSW Treatment Plan for Primary Diagnosis: <principal problem not specified> Long Term Goal(s): Safe transition to appropriate next level of care at discharge, Engage patient in therapeutic group addressing interpersonal concerns.  Short Term Goals: Engage patient in aftercare planning with referrals and resources  Therapeutic Interventions: Assess for all discharge needs, 1 to 1 time with Social worker, Explore available resources and support systems, Assess for adequacy in community support network, Educate family and significant other(s) on suicide prevention, Complete  Psychosocial Assessment, Interpersonal group therapy.  Evaluation of Outcomes: Not Met  Pt unsure of dispositional plan up on d/c. States she and children have been evicted and she has nowhere to go. Is hopeful that boyfriend is working on a plan.     Progress in Treatment: Attending groups: No Participating in groups: No Taking medication as prescribed: Yes Toleration medication: Yes, no side effects reported at this time Family/Significant other contact made: No Patient understands diagnosis: Yes AEB asking for help with depression and voices Discussing patient identified problems/goals with staff: Yes Medical problems stabilized or resolved: Yes Denies suicidal/homicidal ideation: Yes Issues/concerns per patient self-inventory: None Other: N/A  New problem(s) identified: None identified at this time.   New Short Term/Long Term Goal(s): None identified at this time.   Discharge Plan or Barriers:   Reason for Continuation of Hospitalization: Depression Hallucinations Medication stabilization Suicidal ideation   Estimated Length of Stay: 10/3   Attendees: Patient:  05/22/2017  8:08 AM  Physician: Neita Garnet, MD 05/22/2017  8:08 AM  Nursing: Sena Hitch, RN 05/22/2017  8:08 AM  RN Care Manager: Lars Pinks, RN 05/22/2017  8:08 AM  Social Worker: Ripley Fraise 05/22/2017  8:08 AM  Recreational Therapist: Winfield Cunas 05/22/2017  8:08 AM  Other: Norberto Sorenson 05/22/2017  8:08 AM  Other:  05/22/2017  8:08 AM    Scribe for Treatment Team:  Roque Lias LCSW 05/22/2017 8:08 AM

## 2017-05-22 NOTE — Progress Notes (Signed)
Recreation Therapy Notes  Date: 05/22/17 Time: 1000 Location: 500 Hall Dayroom  Group Topic: Communication, Team Building, Problem Solving  Goal Area(s) Addresses:  Patient will effectively work with peer towards shared goal.  Patient will identify skill used to make activity successful.  Patient will identify how skills used during activity can be used to reach post d/c goals.   Intervention: STEM Activity   Activity: [employer]. In teams, patients were asked to build the tallest freestanding tower possible out of 15 pipe cleaners. Systematically resources were removed, for example patient ability to use both hands and patient ability to verbally communicate.    Education:Social Skills, Discharge Planning.   Education Outcome: Acknowledges education/In group clarification offered/Needs additional education.   Clinical Observations/Feedback: Pt did not attend group.   Caroll Rancher, LRT/CTRS         Lillia Abed, Abbigael Detlefsen A 05/22/2017 1:02 PM

## 2017-05-22 NOTE — Progress Notes (Signed)
D: Pt denies SI/HI/AVH. Pt is pleasant and cooperative. Pt stated the Risperdal " Helped a lot". Pt said she was feeling better about her situation and was very visible on the unit.   A: Pt was offered support and encouragement. Pt was given scheduled medications. Pt was encourage to attend groups. Q 15 minute checks were done for safety.   R:Pt attends groups and interacts  sparing with peers and staff, but appropriate . Pt is taking medication. Pt has no complaints.Pt receptive to treatment and safety maintained on unit.

## 2017-05-22 NOTE — Progress Notes (Signed)
Recreation Therapy Notes  INPATIENT RECREATION THERAPY ASSESSMENT  Patient Details Name: Brannon Decaire MRN: [id number] DOB: 29-May-1981 Today's Date: 05/22/2017  Patient Stressors: Other (Comment) (People around me)  Pt stated she was here because her doctor sent her here.  Coping Skills:   Isolate, Avoidance, Self-Injury, Exercise, Art/Dance, Talking, Music, Sports  Personal Challenges: Anger, Communication, Concentration, Decision-Making, Expressing Yourself, Problem-Solving, Social Interaction, Stress Management, Time Management, Trusting Others, Work Nutritional therapist (2+):  Individual - Reading, Individual - Writing, Social - Family  Awareness of Community Resources:  No  Patient Strengths:  My kids  Patient Identified Areas of Improvement:  My kids; being me  Current Recreation Participation:  Every other day  Patient Goal for Hospitalization:  "Get my mind together and be stress free"  [place] of Residence:  [place] of Residence:  [place]  Current [place] (including self-harm):  No  Current HI:  No  Consent to Intern Participation: N/A   Caroll Rancher, LRT/CTRS  Caroll Rancher A 05/22/2017, 3:01 PM

## 2017-05-22 NOTE — [facility] Group Notes (Signed)
[facility] LCSW Group Therapy  05/22/2017  1:05 PM  Type of Therapy:  Group therapy  Participation Level:  Active  Participation Quality:  Attentive  Affect:  Flat  Cognitive:  Oriented  Insight:  Limited  Engagement in Therapy:  Limited  Modes of Intervention:  Discussion, Socialization  Summary of Progress/Problems:  Chaplain was here to lead a group on themes of hope and courage. I'm don't have anything to say about self-care right now."  Stayed the entire time, appeared engaged, minimal interaction. "I would like to see my kids smile a lot more."  Norma Moreno B 05/22/2017 1:54 PM

## 2017-05-22 NOTE — Progress Notes (Signed)
Patient did not attend group.

## 2017-05-22 NOTE — [facility] Suicide Risk Assessment (Signed)
[facility] INPATIENT:  Family/Significant Other Suicide Prevention Education  Suicide Prevention Education:  Education Completed; Antonella Upson, mother, [phone]  has been identified by the patient as the family member/significant other with whom the patient will be residing, and identified as the person(s) who will aid the patient in the event of a mental health crisis (suicidal ideations/suicide attempt).  With written consent from the patient, the family member/significant other has been provided the following suicide prevention education, prior to the and/or following the discharge of the patient.  The suicide prevention education provided includes the following:  Suicide risk factors  Suicide prevention and interventions  National Suicide Hotline telephone number  [facility] assessment telephone number  [facility] Emergency Assistance 911  [facility] and/or Residential Mobile Crisis Unit telephone number  Request made of family/significant other to:  Remove weapons (e.g., guns, rifles, knives), all items previously/currently identified as safety concern.    Remove drugs/medications (over-the-counter, prescriptions, illicit drugs), all items previously/currently identified as a safety concern.  The family member/significant other verbalizes understanding of the suicide prevention education information provided.  The family member/significant other agrees to remove the items of safety concern listed above. Mother has all 5 children with her in a 2 bedroom apartment.  She states she does not know where her daughter will go at d/c, but coming there is not an option due to lack of space. She states her daughter has had mental health crises in the past, but none this bad, and none that required hospitalization.  She knows of no guns that her daughter would have access to.  Ida Rogue 05/22/2017, 10:58 AM
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
## 2017-05-22 NOTE — Progress Notes (Signed)
DAR NOTE: Patient presents with flat affect and depressed mood.  Denies pain, auditory and visual hallucinations.  Described energy level as normal and concentration as good.  Rates depression at 3, hopelessness at 3, and anxiety at 3.  Maintained on routine safety checks.  Medications given as prescribed.  Support and encouragement offered as needed.  Attended group and participated.  States goal for today is "not to hurt other myself or other people."  Patient remained withdrawn and isolative on the unit.  Offered no complaint.

## 2017-05-22 NOTE — Plan of Care (Signed)
Problem: Coping: Goal: Ability to cope will improve Outcome: Progressing Pt denies at this time  Problem: Coping: Goal: Ability to cope will improve Outcome: Progressing Pt denies AVH tonight, pt stated the voices were getting better

## 2017-05-23 DIAGNOSIS — F1910 Other psychoactive substance abuse, uncomplicated: Secondary | ICD-10-CM

## 2017-05-23 DIAGNOSIS — G4700 Insomnia, unspecified: Secondary | ICD-10-CM

## 2017-05-23 DIAGNOSIS — Z6379 Other stressful life events affecting family and household: Secondary | ICD-10-CM

## 2017-05-23 DIAGNOSIS — F419 Anxiety disorder, unspecified: Secondary | ICD-10-CM

## 2017-05-23 LAB — PROLACTIN: PROLACTIN: 65.6 ng/mL — AB (ref 4.8–23.3)

## 2017-05-23 MED ORDER — NICOTINE 21 MG/24HR TD PT24
21.0000 mg | MEDICATED_PATCH | Freq: Every day | TRANSDERMAL | Status: DC
  Administered 2017-05-23 – 2017-05-25 (×3): 21 mg via TRANSDERMAL
  Filled 2017-05-23 (×5): qty 1
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
## 2017-05-23 NOTE — Plan of Care (Signed)
Problem: Safety: Goal: Periods of time without injury will increase Outcome: Completed/Met Date Met: 05/23/17 Patient has not engaged in self harm and denies thoughts to do so. Denies SI.  Problem: Medication: Goal: Compliance with prescribed medication regimen will improve Outcome: Completed/Met Date Met: 05/23/17 Patient has been med compliant.

## 2017-05-23 NOTE — Progress Notes (Signed)
Patient ID: Norma Moreno, female   DOB: Jul 11, 1981, 36 y.o.   MRN: [id number]  Pt currently presents with a guarded affect and paranoid ehavior. Pt minimizing. Reports with a smile that she is "feeling great." During medication administration pt requests writer to review medications numerous times, is trepidatious about taking them. Pt reports good sleep with current medication regimen "two nights ago." Pt states "I don't want to be tried tomorrow morning." Pt visitor requests to speak with MD and CSW about patients discharge. Visitor states "I think she is better now, I think she is ready to come home."  Pt provided with medications per providers orders. Pt's labs and vitals were monitored throughout the night. Pt given a 1:1 about emotional and mental status. Pt supported and encouraged to express concerns and questions. Pt educated on medications and indications. Provided with comfort measures throughout the night.  Pt's safety ensured with 15 minute and environmental checks. Pt currently denies SI/HI and A/V hallucinations. Pt verbally agrees to seek staff if SI/HI or A/VH occurs and to consult with staff before acting on any harmful thoughts. Will continue POC.

## 2017-05-23 NOTE — [facility] Group Notes (Signed)
  [facility] LCSW Group Therapy Note  Date/Time:  05/23/2017 11:15AM-12:00PM  Type of Therapy and Topic:  Group Therapy:  Feelings About Hospitalization  Participation Level:  Active   Description of Group This process group involved patients discussing their feelings related to being hospitalized, as well as the benefits they see to being in the hospital.  These feelings and benefits were itemized.  The group then brainstormed specific ways in which they could seek those same benefits when they discharge and return home.  Therapeutic Goals 1. Patient will identify and describe positive and negative feelings related to hospitalization 2. Patient will verbalize benefits of hospitalization to themselves personally 3. Patients will brainstorm together ways they can obtain similar benefits in the outpatient setting, identify barriers to wellness and possible solutions  Summary of Patient Progress:  The patient expressed her primary feelings about being hospitalized are good, because she felt she needed to be away from the outside world for awhile.  She stated she is accepting treatment, willing and positive about it.  Therapeutic Modalities Cognitive Behavioral Therapy Motivational Interviewing    Ambrose Mantle, LCSW 05/23/2017, 3:25 PM

## 2017-05-23 NOTE — Progress Notes (Signed)
D: Patient observed isolative to room. Minimal information forwarded.  Patient's affect flat, sad with depressed mood. Per self inventory and discussions with writer, rates depression, hopelessness and anxiety all at a 0/10. Rates sleep as good, appetite as good, energy as normal and concentration as good.  States goal for today is to "work on myself and get my GED. Talk to someone about my meds." Denies pain, physical problems.   A: Medicated per orders, no prns required or requested. Level III obs in place for safety. Emotional support offered and self inventory reviewed. Encouraged completion of Suicide Safety Plan and programming participation. Discussed POC with MD.   R: Patient verbalizes understanding of POC. Patient denies SI/HI/AVH and remains safe on level III obs. Will continue to monitor closely and make verbal contact frequently.

## 2017-05-23 NOTE — Progress Notes (Signed)
Patient attended group and said that her day was a 10.  Patient said she stayed out of her room more today than yesterday.

## 2017-05-24 NOTE — Progress Notes (Signed)
Received call from patient's boyfriend, Aurther Loft (consent on chart), who inquired about when patient will discharge. Boyfriend feels patient is ready and has returned to baseline. Will pass to night RN to share with day staff.
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
## 2017-05-24 NOTE — Progress Notes (Signed)
Norma Moreno was in her room and in bed and did not attend evening wrap-up group.

## 2017-05-24 NOTE — Progress Notes (Signed)
D: Patient observed to be less isolative today. Attending groups and meals. Frequent contacts made 1:1 throughout shift. Patient verbalizes to this Clinical research associate she is much improved and would like to discharge soon. Patient's affect blunted, mood anxious, somewhat suspicious but patient cooperative. Per self inventory and discussions with writer, rates depression, hopelessness and anxiety all at a 0/10. Rates sleep as good, appetite as good, energy as normal and concentration as good.  States goal for today is to "get myself ready to go home and work on getting my GED. Take my meds." Denies pain, physical problems.   A: Medicated per orders, no prns requested or required. Level III obs in place for safety. Emotional support offered and self inventory reviewed. Encouraged completion of Suicide Safety Plan and programming participation. Discussed POC with MD  R: Patient verbalizes understanding of POC. Patient denies SI/HI/AVH and remains safe on level III obs. Will continue to monitor closely and make verbal contact frequently.

## 2017-05-24 NOTE — [facility] Group Notes (Signed)
[facility] Group Notes:  (Nursing/MHT/Case Management/Adjunct)  Date:  05/24/2017  Time:  1330  Type of Therapy:  Nurse Education - Guided Meditation for Relaxation and Mindfulness  Participation Level:  Active  Participation Quality:  Attentive  Affect:  Appropriate  Cognitive:  Appropriate  Insight:  Improving  Engagement in Group:  Engaged  Modes of Intervention:  Education  Summary of Progress/Problems: Patient attended group and participated appropriately.    Lawrence Marseilles 05/24/2017, 2:44 PM

## 2017-05-24 NOTE — Progress Notes (Addendum)
Patient ID: Norma Moreno, female   DOB: 01/14/1981, 36 y.o.   MRN: [id number]  Pt currently presents with an appropriate affect and anxious behavior. Nonverbal behavior indicates some paranoia. Pt reports to writer that their goal is to "go back to my apartment, I don't have to go live with my mom anymore." Pt states "I am still worried about getting to work and my doctors appointments though." Pt reports good sleep with current medication regimen. Complains of constipation.   Pt provided with scheduled and as needed medications per providers orders. Pt's labs and vitals were monitored throughout the night. Pt given a 1:1 about emotional and mental status. Pt supported and encouraged to express concerns and questions. Pt educated on medications.  Pt's safety ensured with 15 minute and environmental checks. Pt currently denies SI/HI and A/V hallucinations. Pt verbally agrees to seek staff if SI/HI or A/VH occurs and to consult with staff before acting on any harmful thoughts. Will continue POC.

## 2017-05-24 NOTE — Plan of Care (Signed)
Problem: Education: Goal: Verbalization of understanding the information provided will improve Outcome: Completed/Met Date Met: 05/24/17 Patient verbalizes understanding of information, education provided.   Problem: Safety: Goal: Ability to remain free from injury will improve Outcome: Completed/Met Date Met: 05/24/17 Patient has not engaged in self harm, denies thoughts to do so. No SI expressed.

## 2017-05-24 NOTE — [facility] Group Notes (Signed)
[facility] LCSW Group Therapy Note  Date/Time:  05/24/2017  11:00AM-12:00PM  Type of Therapy and Topic:  Group Therapy:  Music and Mood  Participation Level:  Active   Description of Group: In this process group, members listened to a variety of genres of music and identified that different types of music evoke different responses.  Patients were encouraged to identify music that was soothing for them and music that was energizing for them.  Patients discussed how this knowledge can help with wellness and recovery in various ways including managing depression and anxiety as well as encouraging healthy sleep habits.    Therapeutic Goals: 1. Patients will explore the impact of different varieties of music on mood 2. Patients will verbalize the thoughts they have when listening to different types of music 3. Patients will identify music that is soothing to them as well as music that is energizing to them 4. Patients will discuss how to use this knowledge to assist in maintaining wellness and recovery 5. Patients will explore the use of music as a coping skill  Summary of Patient Progress:  At the beginning of group, patient expressed she felt good and at the end of group she said she felt happy.  Therapeutic Modalities: Solution Focused Brief Therapy Motivational Interviewing Activity   Ambrose Mantle, LCSW 05/24/2017 8:17 AM

## 2017-05-25 DIAGNOSIS — F333 Major depressive disorder, recurrent, severe with psychotic symptoms: Secondary | ICD-10-CM

## 2017-05-25 MED ORDER — TRAZODONE HCL 50 MG PO TABS
50.0000 mg | ORAL_TABLET | Freq: Every evening | ORAL | 0 refills | Status: DC | PRN
Start: 2017-05-25 — End: 2018-03-22

## 2017-05-25 MED ORDER — NICOTINE 21 MG/24HR TD PT24: 21.0000 mg | MEDICATED_PATCH | Freq: Every day | TRANSDERMAL | 0 refills | Status: DC

## 2017-05-25 MED ORDER — CITALOPRAM HYDROBROMIDE 10 MG PO TABS: 10.0000 mg | ORAL_TABLET | Freq: Every day | ORAL | 0 refills | Status: DC

## 2017-05-25 MED ORDER — ARIPIPRAZOLE 15 MG PO TABS: 7.5000 mg | ORAL_TABLET | Freq: Every day | ORAL | 0 refills | Status: DC

## 2017-05-25 NOTE — [facility] Suicide Risk Assessment (Signed)
[facility] Discharge Suicide Risk Assessment   Principal Problem: MDD (major depressive disorder), recurrent, severe, with psychosis (HCC) Discharge Diagnoses:  Patient Active Problem List   Diagnosis Date Noted  . MDD (major depressive disorder), recurrent, severe, with psychosis (HCC) [F33.3] 05/20/2017  . Acute pyelonephritis [N10] 03/08/2017  . AKI (acute kidney injury) (HCC) [N17.9] 03/08/2017  . Thrombocytopenia (HCC) [D69.6] 03/08/2017  . Diarrhea [R19.7] 03/08/2017  . Abdominal pain [R10.9] 03/08/2017  . Pyelonephritis [N12] 03/08/2017  . Asthma [J45.909]   . Tobacco abuse [Z72.0]     Total Time spent with patient: 30 minutes  Musculoskeletal: Strength & Muscle Tone: within normal limits Gait & Station: normal Patient leans: N/A  Psychiatric Specialty Exam: ROS denies chest pain, no shortness of breath, no vomiting  Blood pressure 112/68, pulse 70, temperature 99.8 F (37.7 C), temperature source Oral, resp. rate 16, height  (1.626 m), weight 70.3 kg (155 lb).Body mass index is 26.61 kg/m.  General Appearance: Well Groomed  Patent attorney::  improved   Speech:  Normal Rate409  Volume:  Normal  Mood:  improved, states she feels much better than prior to admission and currently minimizes depression  Affect:  Appropriate and fuller in range  Thought Process:  Linear and Descriptions of Associations: Intact  Orientation:  Full (Time, Place, and Person)  Thought Content:  denies hallucinations, no delusions,not internally preoccupied   Suicidal Thoughts:  No denies suicidal or self injurious ideations, denies any homicidal or violent ideations   Homicidal Thoughts:  No  Memory:  recent and remote grossly intact   Judgement:  Other:  improving   Insight:  improving   Psychomotor Activity:  Normal  Concentration:  Good  Recall:  Good  Fund of Knowledge:Good  Language: Good  Akathisia:  Negative  Handed:  Right  AIMS (if indicated):     Assets:  Communication  Skills Desire for Improvement Resilience  Sleep:  Number of Hours: 6.75  Cognition: WNL  ADL's:  Intact   Mental Status Per Nursing Assessment::   On Admission:     Demographic Factors:  36 year old female, has 5 children who are currently with patient's mother  Loss Factors: Limited support system, recently was evicted from her transitional housing   Historical Factors: History of depression, history of suicide attempts, reports she attempted suicide in 2004, no prior psychiatric admissions  Risk Reduction Factors:   Responsible for children under 55 years of age, Sense of responsibility to family, Living with another person, especially a relative and Positive coping skills or problem solving skills  Continued Clinical Symptoms:  Patient reports significant improvement compared to how she felt at admission . Presents with improved mood, fuller range of affect, improved eye contact, and improved speech. At this time reports much improved mood, and smiles / affect brighter. No thought disorder, no suicidal or self injurious ideations, no homicidal or violent ideations, future oriented . Behavior on unit in good control, presents calm, pleasant on approach. Denies medication side effects. Of note, reports that she found out she is not , after all , going to be evicted,and that she can return to her home - states this has been a major source of relief and has helped her feel much better .  Cognitive Features That Contribute To Risk:  No gross cognitive deficits noted upon discharge. Is alert , attentive, and oriented x 3   Suicide Risk:  Mild:  Suicidal ideation of limited frequency, intensity, duration, and specificity.  There are no  identifiable plans, no associated intent, mild dysphoria and related symptoms, good self-control (both objective and subjective assessment), few other risk factors, and identifiable protective factors, including available and accessible social  support.  Follow-up Information    [facility] Follow up.   Specialty:  Behavioral Health Why:  Psych Eval on Wed 05/28/17 @ 9am with Irving Burton. Fax chart info to [phone]. Contact information: [address] [place] [address] [phone]           Plan Of Care/Follow-up recommendations:  Activity:  as tolerated Diet:  regular Tests:  NA Other:  see below  Patient is leaving unit in good spirits  Plans to return home - see above  Follow up as above   Craige Cotta, MD 05/25/2017, 1:04 PM

## 2017-05-25 NOTE — Progress Notes (Signed)
  [facility] Adult Case Management Discharge Plan :  Will you be returning to the same living situation after discharge:  Yes,  home At discharge, do you have transportation home?: Yes,  father Do you have the ability to pay for your medications: Yes,  mental health, samples  Release of information consent forms completed and in the chart;  Patient's signature needed at discharge.  Patient to Follow up at: Follow-up Information    [facility] Follow up.   Specialty:  Behavioral Health Why:  Psych Eval on Wed 05/28/17 @ 9am with Irving Burton. Fax chart info to [phone]. Contact information: [address] [place] [address] [phone]           Next level of care provider has access to [facility] Link:no  Safety Planning and Suicide Prevention discussed: Yes,  yes  Have you used any form of tobacco in the last 30 days? (Cigarettes, Smokeless Tobacco, Cigars, and/or Pipes): Yes  Has patient been referred to the Quitline?: Patient refused referral  Patient has been referred for addiction treatment: N/A  Ida Rogue, LCSW 05/25/2017, 1:08 PM

## 2017-05-25 NOTE — Plan of Care (Signed)
Problem: [facility] Participation in Recreation Therapeutic Interventions Goal: STG-Patient will attend/participate in Rec Therapy Group Ses STG-The Patient will attend and participate in Recreation Therapy Group Sessions  Outcome: Adequate for Discharge Pt attended and participated in coping skills recreation therapy group session.  Caroll Rancher, LRT/CTRS

## 2017-05-25 NOTE — Progress Notes (Signed)
Pt discharged to lobby. Pt was stable and appreciative at that time. All papers and prescriptions were given and valuables returned. Verbal understanding expressed. Denies SI/HI and A/VH. Pt given opportunity to express concerns and ask questions.  

## 2017-05-25 NOTE — Progress Notes (Signed)
Recreation Therapy Notes  INPATIENT RECREATION TR PLAN  Patient Details Name: Norma Moreno MRN: [id number] DOB: 03/25/1981 Today's Date: 05/25/2017  Rec Therapy Plan Is patient appropriate for Therapeutic Recreation?: Yes Treatment times per week: about 3 days  Estimated Length of Stay: 5-7 days TR Treatment/Interventions: Group participation (Comment)  Discharge Criteria Pt will be discharged from therapy if:: Discharged Treatment plan/goals/alternatives discussed and agreed upon by:: Patient/family  Discharge Summary Short term goals set: Patient will attend and participate in Recreation Therapy Group Sessions  Short term goals met: Adequate for discharge Progress toward goals comments: Groups attended Which groups?: Coping skills Reason goals not met: None Therapeutic equipment acquired: N/A Reason patient discharged from therapy: Discharge from hospital Pt/family agrees with progress & goals achieved: Yes Date patient discharged from therapy: 05/25/17   Victorino Sparrow, LRT/CTRS  Ria Comment, [place] Phifer A 05/25/2017, 3:33 PM

## 2017-05-25 NOTE — Discharge Summary (Signed)
Physician Discharge Summary Note  Patient:  Norma Moreno is an 36 y.o., female MRN:  [id number] DOB:  Jun 16, 1981 Patient phone:  [phone] (home)  Patient address:   [address] [place] [address],  Total Time spent with patient: 30 minutes  Date of Admission:  05/20/2017 Date of Discharge:  05/25/2017  Reason for Admission: Per H&P- Patient reports worsening depression and sadness, and states she has been depressed for " months, years ". She reports worsening depression recently, and has been facing some significant psychosocial stressors- limited support system, recently being evicted from transitional housing , leading her to be homeless at this time.  She presented to ED voluntarily , reporting severe depression, suicidal ideations of walking in front of a truck or of overdosing , and also reported hallucinations. She states she has been " seeing spiders, seeing shadows", and endorses auditory hallucinations " they say different stuff, say things like " don't listen to him". She states she does have hallucinations telling her to kill herself .  She describes history of chronic suicidal ideations, which are intermittent " come and go" but have been worse recently.  Also reports homicidal ideations towards " a group of people who live around where I did" but does not specify .   Principal Problem: MDD (major depressive disorder), recurrent, severe, with psychosis [facility]) Discharge Diagnoses: Patient Active Problem List   Diagnosis Date Noted  . MDD (major depressive disorder), recurrent, severe, with psychosis (HCC) [F33.3] 05/20/2017  . Acute pyelonephritis [N10] 03/08/2017  . AKI (acute kidney injury) (HCC) [N17.9] 03/08/2017  . Thrombocytopenia (HCC) [D69.6] 03/08/2017  . Diarrhea [R19.7] 03/08/2017  . Abdominal pain [R10.9] 03/08/2017  . Pyelonephritis [N12] 03/08/2017  . Asthma [J45.909]   . Tobacco abuse [Z72.0]     Past Psychiatric History:   Past Medical  History:  Past Medical History:  Diagnosis Date  . Arthritis   . Asthma   . Back pain   . Tobacco abuse     Past Surgical History:  Procedure Laterality Date  . CESAREAN SECTION     x 4  . CHOLECYSTECTOMY     Family History:  Family History  Problem Relation Age of Onset  . Kidney Stones Father    Family Psychiatric  History:  Social History:  History  Alcohol Use No     History  Drug Use No    Social History   Social History  . Marital status: Single    Spouse name: N/A  . Number of children: N/A  . Years of education: N/A   Social History Main Topics  . Smoking status: Current Every Day Smoker    Types: Cigars  . Smokeless tobacco: Never Used  . Alcohol use No  . Drug use: No  . Sexual activity: Not Asked   Other Topics Concern  . None   Social History Narrative  . None    Hospital Course: Norma Moreno was admitted for MDD (major depressive disorder), recurrent, severe, with psychosis (HCC) and crisis management.  Pt was treated discharged with the medications listed below under Medication List.  Medical problems were identified and treated as needed.  Home medications were restarted as appropriate.  Improvement was monitored by observation and Norma Moreno 's daily report of symptom reduction.  Emotional and mental status was monitored by daily self-inventory reports completed by Norma Moreno and clinical staff.       Norma Moreno was evaluated by the treatment team for stability and  plans for continued recovery upon discharge. Norma Moreno 's motivation was an integral factor for scheduling further treatment. Employment, transportation, bed availability, health status, family support, and any pending legal issues were also considered during hospital stay. Pt was offered further treatment options upon discharge including but not limited to Residential, Intensive Outpatient, and Outpatient treatment.  Norma Moreno will follow up with the  services as listed below under Follow Up Information.     Upon completion of this admission the patient was both mentally and medically stable for discharge denying suicidal/homicidal ideation, auditory/visual/tactile hallucinations, delusional thoughts and paranoia.    Norma Moreno responded well to treatment with Celexa 10 mg and Abilify 7.5mg   and without adverse effects. Pt demonstrated improvement without reported or observed adverse effects to the point of stability appropriate for outpatient management. Pertinent labs include: CBC and CMP Prolactin 65.6 (high) for which outpatient follow-up is necessary for lab recheck as mentioned below. Reviewed CBC, CMP, BAL, and UDS; all unremarkable aside from noted exceptions.   Physical Findings: AIMS: Facial and Oral Movements Muscles of Facial Expression: None, normal Lips and Perioral Area: None, normal Jaw: None, normal Tongue: None, normal,Extremity Movements Upper (arms, wrists, hands, fingers): None, normal Lower (legs, knees, ankles, toes): None, normal, Trunk Movements Neck, shoulders, hips: None, normal, Overall Severity Severity of abnormal movements (highest score from questions above): None, normal Incapacitation due to abnormal movements: None, normal Patient's awareness of abnormal movements (rate only patient's report): No Awareness, Dental Status Current problems with teeth and/or dentures?: No Does patient usually wear dentures?: No  CIWA:    COWS:     Musculoskeletal: Strength & Muscle Tone: within normal limits Gait & Station: normal Patient leans: N/A  Psychiatric Specialty Exam: See SRA by MD Physical Exam  Vitals reviewed. Constitutional: She appears well-developed.  Cardiovascular: Normal rate.   Skin: Skin is warm and dry.    Review of Systems  Psychiatric/Behavioral: Negative for depression (stable) and suicidal ideas. The patient is not nervous/anxious and does not have insomnia.     Blood pressure  112/68, pulse 70, temperature 99.8 F (37.7 C), temperature source Oral, resp. rate 16, height  (1.626 m), weight 70.3 kg (155 lb).Body mass index is 26.61 kg/m.  Have you used any form of tobacco in the last 30 days? (Cigarettes, Smokeless Tobacco, Cigars, and/or Pipes): Yes  Has this patient used any form of tobacco in the last 30 days? (Cigarettes, Smokeless Tobacco, Cigars, and/or Pipes)  Yes, A prescription for an FDA-approved tobacco cessation medication was offered at discharge and the patient refused  Blood Alcohol level:  No results found for: [facility]  Metabolic Disorder Labs:  Lab Results  Component Value Date   HGBA1C 5.1 05/22/2017   MPG 99.67 05/22/2017   Lab Results  Component Value Date   PROLACTIN 65.6 (H) 05/22/2017   Lab Results  Component Value Date   CHOL 115 05/22/2017   TRIG 103 05/22/2017   HDL 39 (L) 05/22/2017   CHOLHDL 2.9 05/22/2017   VLDL 21 05/22/2017   LDLCALC 55 05/22/2017    See Psychiatric Specialty Exam and Suicide Risk Assessment completed by Attending Physician prior to discharge.  Discharge destination:  Home  Is patient on multiple antipsychotic therapies at discharge:  No   Has Patient had three or more failed trials of antipsychotic monotherapy by history:  No  Recommended Plan for Multiple Antipsychotic Therapies: NA  Discharge Instructions    Diet - low sodium heart healthy    Complete  by:  As directed    Discharge instructions    Complete by:  As directed    Take all medications as prescribed. Keep all follow-up appointments as scheduled.  Do not consume alcohol or use illegal drugs while on prescription medications. Report any adverse effects from your medications to your primary care provider promptly.  In the event of recurrent symptoms or worsening symptoms, call 911, a crisis hotline, or go to the nearest emergency department for evaluation.   Increase activity slowly    Complete by:  As directed      Allergies as  of 05/25/2017   No Known Allergies     Medication List    STOP taking these medications   aspirin-acetaminophen-caffeine 250-250-65 MG tablet Commonly known as:  EXCEDRIN MIGRAINE     TAKE these medications     Indication  ARIPiprazole 15 MG tablet Commonly known as:  ABILIFY Take 0.5 tablets (7.5 mg total) by mouth at bedtime.  Indication:  Major Depressive Disorder   citalopram 10 MG tablet Commonly known as:  CELEXA Take 1 tablet (10 mg total) by mouth daily.  Indication:  mood stablilzation   nicotine 21 mg/24hr patch Commonly known as:  NICODERM CQ - dosed in mg/24 hours Place 1 patch (21 mg total) onto the skin daily.  Indication:  Nicotine Addiction   traZODone 50 MG tablet Commonly known as:  DESYREL Take 1 tablet (50 mg total) by mouth at bedtime as needed for sleep.  Indication:  Trouble Sleeping      Follow-up Information    [facility].   Specialty:  Behavioral Health Why:  Psych Eval on Wed 05/28/17 @ 9am with Irving Burton. Fax chart info to [phone]. [address] [place] [address] [phone]           Follow-up recommendations:  Activity:  as tolerated Diet:  heart healthy  Comments:  Take all medications as prescribed. Keep all follow-up appointments as scheduled.  Do not consume alcohol or use illegal drugs while on prescription medications. Report any adverse effects from your medications to your primary care provider promptly.  In the event of recurrent symptoms or worsening symptoms, call 911, a crisis hotline, or go to the nearest emergency department for evaluation.   Signed: Oneta Rack, NP 05/25/2017, 9:39 AM

## 2017-05-25 NOTE — Progress Notes (Signed)
Recreation Therapy Notes  Date: 05/25/17 Time: 1000 Location: 500 Hall Dayroom  Group Topic: Coping Skills  Goal Area(s) Addresses:  Patients will be able to identify positive coping skills. Patients will be able to identify benefits of using coping skills post d/c.  Behavioral Response: Engaged  Intervention:  Magazines, scissors, glue sticks, Holiday representative paper  Activity: Coping Skills.  Patients were given a worksheet divided into five categories (diversions, social, tension releaser, cognitive and physical.  Patients were to cut out and identify at least 2 coping skills for each section from the magazines provided.  Education: Pharmacologist, Building control surveyor.   Education Outcome: Acknowledges understanding/In group clarification offered/Needs additional education.   Clinical Observations/Feedback: Pt stated her coping skills were cooking for diversions; call somebody for social; exercise or take a ride for tension releaser; and shopping, breathing and play with dog for physical.  Pt stated using her coping skills would keep her from coming back here, help her take her medications right and get on a daily schedule.   Caroll Rancher, LRT/CTRS         Lillia Abed, Supriya Beaston A 05/25/2017 3:15 PM

## 2017-06-24 ENCOUNTER — Encounter (HOSPITAL_COMMUNITY): Payer: Self-pay | Admitting: Emergency Medicine

## 2017-06-24 ENCOUNTER — Ambulatory Visit (HOSPITAL_COMMUNITY)
Admission: EM | Admit: 2017-06-24 | Discharge: 2017-06-24 | Disposition: A | Discharge status: Home / Self Care | Payer: Medicaid Other | Attending: Family Medicine | Admitting: Family Medicine

## 2017-06-24 DIAGNOSIS — Z3202 Encounter for pregnancy test, result negative: Secondary | ICD-10-CM | POA: Diagnosis not present

## 2017-06-24 DIAGNOSIS — N644 Mastodynia: Secondary | ICD-10-CM

## 2017-06-24 HISTORY — DX: Major depressive disorder, single episode, unspecified: F32.9

## 2017-06-24 HISTORY — DX: Depression, unspecified: F32.A

## 2017-06-24 HISTORY — DX: Unspecified psychosis not due to a substance or known physiological condition: F29

## 2017-06-24 LAB — POCT PREGNANCY, URINE: Preg Test, Ur: NEGATIVE

## 2017-06-24 MED ORDER — IBUPROFEN 800 MG PO TABS: 800.0000 mg | ORAL_TABLET | Freq: Three times a day (TID) | ORAL | 0 refills | Status: AC

## 2017-06-24 MED ORDER — NAPROXEN 500 MG PO TABS: 500.0000 mg | ORAL_TABLET | Freq: Two times a day (BID) | ORAL | 0 refills | Status: DC

## 2017-06-24 NOTE — Discharge Instructions (Signed)
Pregnancy test negative. Start ibuprofen for pain and inflammation. Follow up with OBGYN for further evaluation and treatment needed.

## 2017-06-24 NOTE — ED Triage Notes (Signed)
Pt sts bilateral breast tenderness upon waking this am; pt sts LMP was 06/06/17

## 2017-06-24 NOTE — ED Provider Notes (Signed)
[facility]    CSN: [id number] Arrival date & time: 06/24/17  1348     History   Chief Complaint Chief Complaint  Patient presents with  . Breast Pain    HPI Norma Moreno is a 36 y.o. female.   36 year old female with history of arthritis, asthma, depression comes in for 1 day history of bilateral breast tenderness. Patient states she woke up this morning feeling tenderness of the breast that she noticed even more when removing her bra to shower. She denies any trauma, spreading erythema, increased warmth. Denies nipple discharge, changes in skin texture. Denies breast feeding. LMP 06/06/2017. Sexually active with one partner, no condom use, no forms of birth control use. She denies fever, chills, night sweats. Denies coughing. She has not done anything for the pain. She states she had similar symptoms during her pregnancy, otherwise has not experienced since.       Past Medical History:  Diagnosis Date  . Arthritis   . Asthma   . Back pain   . Depression   . Psychosis (HCC)   . Tobacco abuse     Patient Active Problem List   Diagnosis Date Noted  . Severe episode of recurrent major depressive disorder, with psychotic features (HCC)   . MDD (major depressive disorder), recurrent, severe, with psychosis (HCC) 05/20/2017  . Acute pyelonephritis 03/08/2017  . AKI (acute kidney injury) (HCC) 03/08/2017  . Thrombocytopenia (HCC) 03/08/2017  . Diarrhea 03/08/2017  . Abdominal pain 03/08/2017  . Pyelonephritis 03/08/2017  . Asthma   . Tobacco abuse     Past Surgical History:  Procedure Laterality Date  . CESAREAN SECTION     x 4  . CHOLECYSTECTOMY      OB History    No data available       Home Medications    Prior to Admission medications   Medication Sig Start Date End Date Taking? Authorizing Provider  ARIPiprazole (ABILIFY) 15 MG tablet Take 0.5 tablets (7.5 mg total) by mouth at bedtime. 05/25/17   Oneta RackLewis, Tanika N, NP  citalopram (CELEXA)  10 MG tablet Take 1 tablet (10 mg total) by mouth daily. 05/26/17   Oneta RackLewis, Tanika N, NP  ibuprofen (ADVIL,MOTRIN) 800 MG tablet Take 1 tablet (800 mg total) by mouth 3 (three) times daily. 06/24/17 07/09/17  Belinda FisherYu, Axie Hayne V, PA-C  nicotine (NICODERM CQ - DOSED IN MG/24 HOURS) 21 mg/24hr patch Place 1 patch (21 mg total) onto the skin daily. 05/26/17   Oneta RackLewis, Tanika N, NP  traZODone (DESYREL) 50 MG tablet Take 1 tablet (50 mg total) by mouth at bedtime as needed for sleep. 05/25/17   Oneta RackLewis, Tanika N, NP    Family History Family History  Problem Relation Age of Onset  . Kidney Stones Father     Social History Social History  Substance Use Topics  . Smoking status: Current Every Day Smoker    Types: Cigars  . Smokeless tobacco: Never Used  . Alcohol use No     Allergies   Patient has no known allergies.   Review of Systems Review of Systems  Reason unable to perform ROS: See HPI as above.     Physical Exam Triage Vital Signs ED Triage Vitals [06/24/17 1403]  Enc Vitals Group     BP (!) 162/117     Pulse Rate 89     Resp 18     Temp 98.3 F (36.8 C)     Temp Source Oral  SpO2 99 %     Weight      Height      Head Circumference      Peak Flow      Pain Score      Pain Loc      Pain Edu?      Excl. in GC?    No data found.   Updated Vital Signs BP (!) 162/117 (BP Location: Right Arm)   Pulse 89   Temp 98.3 F (36.8 C) (Oral)   Resp 18   LMP 06/06/2017   SpO2 99%   Physical Exam  Constitutional: She is oriented to person, place, and time. She appears well-developed and well-nourished. No distress.  HENT:  Head: Normocephalic and atraumatic.  Eyes: Pupils are equal, round, and reactive to light. Conjunctivae are normal.  Pulmonary/Chest: Right breast exhibits no inverted nipple, no mass, no nipple discharge and no skin change. Left breast exhibits no inverted nipple, no mass, no nipple discharge and no skin change. Breasts are symmetrical.  No asymmetry noted.  No erythema, increased warmth. No nipple discharge seen. Diffuse tenderness on palpation of the breasts. No chest wall pain.   Neurological: She is alert and oriented to person, place, and time.     UC Treatments / Results  Labs (all labs ordered are listed, but only abnormal results are displayed) Labs Reviewed  POCT PREGNANCY, URINE    EKG  EKG Interpretation None       Radiology No results found.  Procedures Procedures (including critical care time)  Medications Ordered in UC Medications - No data to display   Initial Impression / Assessment and Plan / UC Course  I have reviewed the triage vital signs and the nursing notes.  Pertinent labs & imaging results that were available during my care of the patient were reviewed by me and considered in my medical decision making (see chart for details).    Urine pregnancy negative. Start NSAIDs as directed. Ice compress as needed. Follow up with GYN for further evaluation and treatment needed.  Final Clinical Impressions(s) / UC Diagnoses   Final diagnoses:  Breast tenderness in female    New Prescriptions Discharge Medication List as of 06/24/2017  2:35 PM    START taking these medications   Details  naproxen (NAPROSYN) 500 MG tablet Take 1 tablet (500 mg total) by mouth 2 (two) times daily., Starting Wed 06/24/2017, Until Thu 07/09/2017, Normal          Linward Headland V, PA-C 06/24/17 1446

## 2017-08-05 ENCOUNTER — Emergency Department (HOSPITAL_COMMUNITY)
Admission: EM | Admit: 2017-08-05 | Discharge: 2017-08-05 | Disposition: A | Discharge status: Home / Self Care | Payer: Medicaid Other | Attending: Emergency Medicine | Admitting: Emergency Medicine

## 2017-08-05 ENCOUNTER — Encounter (HOSPITAL_COMMUNITY): Payer: Self-pay | Admitting: Emergency Medicine

## 2017-08-05 DIAGNOSIS — Y92410 Unspecified street and highway as the place of occurrence of the external cause: Secondary | ICD-10-CM | POA: Diagnosis not present

## 2017-08-05 DIAGNOSIS — Y9389 Activity, other specified: Secondary | ICD-10-CM | POA: Insufficient documentation

## 2017-08-05 DIAGNOSIS — F17290 Nicotine dependence, other tobacco product, uncomplicated: Secondary | ICD-10-CM | POA: Insufficient documentation

## 2017-08-05 DIAGNOSIS — Y999 Unspecified external cause status: Secondary | ICD-10-CM | POA: Insufficient documentation

## 2017-08-05 DIAGNOSIS — S39012A Strain of muscle, fascia and tendon of lower back, initial encounter: Secondary | ICD-10-CM | POA: Insufficient documentation

## 2017-08-05 DIAGNOSIS — J45909 Unspecified asthma, uncomplicated: Secondary | ICD-10-CM | POA: Diagnosis not present

## 2017-08-05 DIAGNOSIS — Z79899 Other long term (current) drug therapy: Secondary | ICD-10-CM | POA: Diagnosis not present

## 2017-08-05 DIAGNOSIS — S3992XA Unspecified injury of lower back, initial encounter: Secondary | ICD-10-CM | POA: Diagnosis present

## 2017-08-05 HISTORY — DX: Spinal stenosis, site unspecified: M48.00

## 2017-08-05 HISTORY — DX: Sickle-cell trait: D57.3

## 2017-08-05 MED ORDER — CYCLOBENZAPRINE HCL 10 MG PO TABS: 10.0000 mg | ORAL_TABLET | Freq: Two times a day (BID) | ORAL | 0 refills | Status: DC | PRN
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
## 2017-08-11 ENCOUNTER — Encounter (HOSPITAL_COMMUNITY): Payer: Self-pay

## 2017-08-11 ENCOUNTER — Emergency Department (HOSPITAL_COMMUNITY)
Admission: EM | Admit: 2017-08-11 | Discharge: 2017-08-11 | Disposition: A | Discharge status: Home / Self Care | Payer: Medicaid Other | Attending: Emergency Medicine | Admitting: Emergency Medicine

## 2017-08-11 ENCOUNTER — Other Ambulatory Visit: Payer: Self-pay

## 2017-08-11 DIAGNOSIS — M549 Dorsalgia, unspecified: Secondary | ICD-10-CM | POA: Diagnosis not present

## 2017-08-11 DIAGNOSIS — Z5321 Procedure and treatment not carried out due to patient leaving prior to being seen by health care provider: Secondary | ICD-10-CM | POA: Diagnosis not present

## 2017-08-11 DIAGNOSIS — N939 Abnormal uterine and vaginal bleeding, unspecified: Secondary | ICD-10-CM | POA: Diagnosis not present

## 2017-08-11 LAB — URINALYSIS, ROUTINE W REFLEX MICROSCOPIC
Bacteria, UA: NONE SEEN
Bilirubin Urine: NEGATIVE
Glucose, UA: NEGATIVE mg/dL
Hgb urine dipstick: NEGATIVE
Ketones, ur: NEGATIVE mg/dL
Nitrite: NEGATIVE
Protein, ur: NEGATIVE mg/dL
Specific Gravity, Urine: 1.019 (ref 1.005–1.030)
Squamous Epithelial / LPF: NONE SEEN
pH: 5 (ref 5.0–8.0)

## 2017-08-11 LAB — POC URINE PREG, ED: Preg Test, Ur: NEGATIVE
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
## 2017-08-11 NOTE — ED Notes (Signed)
Patient states she would like to check herself out.   States she is tired of waiting.  Return precautions reviewed with patient.  Will d/c.

## 2017-08-11 NOTE — ED Triage Notes (Signed)
Pt reports chronic back pain from spinal stenosis that has been worse over the last month. PT reports she is also having very light vaginal bleeding

## 2017-08-12 ENCOUNTER — Emergency Department (HOSPITAL_COMMUNITY): Payer: Medicaid Other

## 2017-08-12 ENCOUNTER — Emergency Department (HOSPITAL_COMMUNITY)
Admission: EM | Admit: 2017-08-12 | Discharge: 2017-08-12 | Disposition: A | Discharge status: Home / Self Care | Payer: Medicaid Other | Attending: Emergency Medicine | Admitting: Emergency Medicine

## 2017-08-12 ENCOUNTER — Other Ambulatory Visit: Payer: Self-pay

## 2017-08-12 ENCOUNTER — Encounter (HOSPITAL_COMMUNITY): Payer: Self-pay | Admitting: Emergency Medicine

## 2017-08-12 DIAGNOSIS — M545 Low back pain: Secondary | ICD-10-CM | POA: Diagnosis not present

## 2017-08-12 DIAGNOSIS — J45909 Unspecified asthma, uncomplicated: Secondary | ICD-10-CM | POA: Diagnosis not present

## 2017-08-12 DIAGNOSIS — F17290 Nicotine dependence, other tobacco product, uncomplicated: Secondary | ICD-10-CM | POA: Diagnosis not present

## 2017-08-12 DIAGNOSIS — Z79899 Other long term (current) drug therapy: Secondary | ICD-10-CM | POA: Insufficient documentation

## 2017-08-12 DIAGNOSIS — G8929 Other chronic pain: Secondary | ICD-10-CM | POA: Diagnosis not present

## 2017-08-12 MED ORDER — OXYCODONE-ACETAMINOPHEN 5-325 MG PO TABS
1.0000 | ORAL_TABLET | Freq: Once | ORAL | Status: AC
  Administered 2017-08-12: 1 via ORAL
  Filled 2017-08-12: qty 1

## 2017-08-12 MED ORDER — BUPIVACAINE-EPINEPHRINE (PF) 0.25% -1:200000 IJ SOLN
10.0000 mL | Freq: Once | INTRAMUSCULAR | Status: DC
  Filled 2017-08-12: qty 30
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
## 2017-08-12 NOTE — ED Notes (Signed)
Pt back from testing.

## 2017-08-12 NOTE — Discharge Instructions (Signed)
Please read attached information. If you experience any new or worsening signs or symptoms please return to the emergency room for evaluation. Please follow-up with your primary care provider or specialist as discussed.  °

## 2017-08-12 NOTE — ED Notes (Signed)
Pt states she understands instructions. Home stable with steady gait. 

## 2017-08-12 NOTE — ED Triage Notes (Signed)
Was seen here last night; says did not get urine results. Also back still hurting. Ambulated to room without difficulty.

## 2017-09-04 ENCOUNTER — Encounter (HOSPITAL_COMMUNITY): Payer: Self-pay | Admitting: Emergency Medicine

## 2017-09-04 ENCOUNTER — Other Ambulatory Visit: Payer: Self-pay

## 2017-09-04 ENCOUNTER — Emergency Department (HOSPITAL_COMMUNITY): Payer: Medicaid Other

## 2017-09-04 ENCOUNTER — Emergency Department (HOSPITAL_COMMUNITY)
Admission: EM | Admit: 2017-09-04 | Discharge: 2017-09-04 | Disposition: A | Discharge status: Home / Self Care | Payer: Medicaid Other | Attending: Emergency Medicine | Admitting: Emergency Medicine

## 2017-09-04 DIAGNOSIS — F17290 Nicotine dependence, other tobacco product, uncomplicated: Secondary | ICD-10-CM | POA: Insufficient documentation

## 2017-09-04 DIAGNOSIS — Z79899 Other long term (current) drug therapy: Secondary | ICD-10-CM | POA: Insufficient documentation

## 2017-09-04 DIAGNOSIS — R0789 Other chest pain: Secondary | ICD-10-CM | POA: Diagnosis not present

## 2017-09-04 DIAGNOSIS — J45909 Unspecified asthma, uncomplicated: Secondary | ICD-10-CM | POA: Insufficient documentation

## 2017-09-04 DIAGNOSIS — R079 Chest pain, unspecified: Secondary | ICD-10-CM | POA: Diagnosis present

## 2017-09-04 LAB — I-STAT TROPONIN, ED: Troponin i, poc: 0 ng/mL (ref 0.00–0.08)

## 2017-09-04 LAB — BASIC METABOLIC PANEL
Anion gap: 7 (ref 5–15)
BUN: 6 mg/dL (ref 6–20)
CHLORIDE: 106 mmol/L (ref 101–111)
CO2: 25 mmol/L (ref 22–32)
Calcium: 8.2 mg/dL — ABNORMAL LOW (ref 8.9–10.3)
Creatinine, Ser: 1.02 mg/dL — ABNORMAL HIGH (ref 0.44–1.00)
GFR calc Af Amer: >60 mL/min (ref >60)
GFR calc non Af Amer: >60 mL/min (ref >60)
GLUCOSE: 87 mg/dL (ref 65–99)
POTASSIUM: 3.6 mmol/L (ref 3.5–5.1)
Sodium: 138 mmol/L (ref 135–145)

## 2017-09-04 LAB — CBC
HEMATOCRIT: 31.7 % — AB (ref 36.0–46.0)
Hemoglobin: 10.2 g/dL — ABNORMAL LOW (ref 12.0–15.0)
MCH: 26 pg (ref 26.0–34.0)
MCHC: 32.2 g/dL (ref 30.0–36.0)
MCV: 80.7 fL (ref 78.0–100.0)
Platelets: 162 10*3/uL (ref 150–400)
RBC: 3.93 MIL/uL (ref 3.87–5.11)
RDW: 15.5 % (ref 11.5–15.5)
WBC: 5 10*3/uL (ref 4.0–10.5)

## 2017-09-04 MED ORDER — HYDROXYZINE HCL 25 MG PO TABS: 25.0000 mg | ORAL_TABLET | Freq: Four times a day (QID) | ORAL | 0 refills | Status: DC | PRN

## 2017-09-04 NOTE — ED Provider Notes (Signed)
[facility] EMERGENCY DEPARTMENT Provider Note   CSN: [id number] Arrival date & time: 09/04/17  0809     History   Chief Complaint Chief Complaint  Patient presents with  . Chest Pain    HPI Norma Moreno is a 37 y.o. female w PMHx anxiety, depression, spinal stenosis, presenting to ED with 1 week of intermittent sharp left-sided chest pains, with assoc shortness of breath.  Reports associated increased anxiety. States pain comes and goes, lasting a few seconds at a time. Pain and SOB are not exertional, states they come when she is feeling more stressed. States she has not been taking her anxiety/depression medications. No recent strenuous activity. No nausea, diaphoresis, radiation of pain, unilateral leg swelling or pain, cough, congestion, hemoptysis, exogenous estrogen use, recent travel or surgery. No cardiac hx.  The history is provided by the patient.    Past Medical History:  Diagnosis Date  . Arthritis   . Asthma   . Back pain   . Depression   . Psychosis (HCC)   . Sickle cell trait (HCC)   . Spinal stenosis   . Tobacco abuse     Patient Active Problem List   Diagnosis Date Noted  . Severe episode of recurrent major depressive disorder, with psychotic features (HCC)   . MDD (major depressive disorder), recurrent, severe, with psychosis (HCC) 05/20/2017  . Acute pyelonephritis 03/08/2017  . AKI (acute kidney injury) (HCC) 03/08/2017  . Thrombocytopenia (HCC) 03/08/2017  . Diarrhea 03/08/2017  . Abdominal pain 03/08/2017  . Pyelonephritis 03/08/2017  . Asthma   . Tobacco abuse     Past Surgical History:  Procedure Laterality Date  . CESAREAN SECTION     x 4  . CHOLECYSTECTOMY      OB History    No data available       Home Medications    Prior to Admission medications   Medication Sig Start Date End Date Taking? Authorizing Provider  acetaminophen (TYLENOL) 500 MG tablet Take 1,000 mg by mouth every 6 (six) hours as needed  for mild pain.    [provider]  ARIPiprazole (ABILIFY) 15 MG tablet Take 0.5 tablets (7.5 mg total) by mouth at bedtime. 05/25/17   Oneta Rack, NP  citalopram (CELEXA) 10 MG tablet Take 1 tablet (10 mg total) by mouth daily. 05/26/17   Oneta Rack, NP  cyclobenzaprine (FLEXERIL) 10 MG tablet Take 1 tablet (10 mg total) by mouth 2 (two) times daily as needed for muscle spasms. 08/05/17   McDonald, Mia A, PA-C  hydrOXYzine (ATARAX/VISTARIL) 25 MG tablet Take 1 tablet (25 mg total) by mouth every 6 (six) hours as needed for anxiety. 09/04/17   Elber Galyean, [place] N, PA-C  ibuprofen (ADVIL,MOTRIN) 200 MG tablet Take 400 mg by mouth every 6 (six) hours as needed for mild pain.    [provider]  nicotine (NICODERM CQ - DOSED IN MG/24 HOURS) 21 mg/24hr patch Place 1 patch (21 mg total) onto the skin daily. Patient not taking: Reported on 08/11/2017 05/26/17   Oneta Rack, NP  traZODone (DESYREL) 50 MG tablet Take 1 tablet (50 mg total) by mouth at bedtime as needed for sleep. 05/25/17   Oneta Rack, NP    Family History Family History  Problem Relation Age of Onset  . Kidney Stones Father     Social History Social History   Tobacco Use  . Smoking status: Current Every Day Smoker    Types: Cigars  .  Smokeless tobacco: Never Used  Substance Use Topics  . Alcohol use: No  . Drug use: No     Allergies   Patient has no known allergies.   Review of Systems Review of Systems  Constitutional: Negative for diaphoresis and fever.  HENT: Negative for congestion.   Respiratory: Positive for shortness of breath. Negative for cough.   Cardiovascular: Positive for chest pain. Negative for palpitations and leg swelling.  Gastrointestinal: Negative for nausea.  Psychiatric/Behavioral: The patient is nervous/anxious.   All other systems reviewed and are negative.    Physical Exam Updated Vital Signs BP 119/81   Pulse 67   Temp 98.2 F (36.8 C) (Oral)   Resp  (!) 24   LMP 08/30/2017   SpO2 99%   Physical Exam  Constitutional: She is oriented to person, place, and time. She appears well-developed and well-nourished. She does not appear ill. No distress.  HENT:  Head: Normocephalic and atraumatic.  Mouth/Throat: Oropharynx is clear and moist.  Eyes: Conjunctivae are normal.  Neck: Normal range of motion. Neck supple. No JVD present.  Cardiovascular: Normal rate, regular rhythm, normal heart sounds, intact distal pulses and normal pulses. Exam reveals no gallop and no friction rub.  No murmur heard. Pulmonary/Chest: Effort normal and breath sounds normal. No stridor. No respiratory distress. She has no wheezes. She has no rales. She exhibits tenderness (left sided).  Abdominal: Soft. Bowel sounds are normal. She exhibits no distension. There is no tenderness. There is no rebound and no guarding.  Musculoskeletal:  No LE edema or tenderness.  Lymphadenopathy:    She has no cervical adenopathy.  Neurological: She is alert and oriented to person, place, and time.  Skin: Skin is warm.  Psychiatric: She has a normal mood and affect. Her behavior is normal.  Nursing note and vitals reviewed.    ED Treatments / Results  Labs (all labs ordered are listed, but only abnormal results are displayed) Labs Reviewed  BASIC METABOLIC PANEL - Abnormal; Notable for the following components:      Result Value   Creatinine, Ser 1.02 (*)    Calcium 8.2 (*)    All other components within normal limits  CBC - Abnormal; Notable for the following components:   Hemoglobin 10.2 (*)    HCT 31.7 (*)    All other components within normal limits  I-STAT TROPONIN, ED    EKG  EKG Interpretation  Date/Time:  Friday September 04 2017 08:20:50 EST Ventricular Rate:  85 PR Interval:  152 QRS Duration: 90 QT Interval:  362 QTC Calculation: 430 R Axis:   74 Text Interpretation:  Normal sinus rhythm Normal ECG No significant change since last tracing Confirmed by  Cathren LaineSteinl, Kevin ([id number]) on 09/04/2017 12:22:38 PM       Radiology Dg Chest 2 View  Result Date: 09/04/2017 CLINICAL DATA:  Chest pain. EXAM: CHEST  2 VIEW COMPARISON:  11/07/2016. FINDINGS: Mediastinum and hilar structures normal. Lungs are clear. No pleural effusion or pneumothorax. Degenerative changes and scoliosis thoracic spine. IMPRESSION: No acute cardiopulmonary disease.  Chest is stable from prior exam. Electronically Signed   By: Maisie Fushomas  Register   On: 09/04/2017 09:14    Procedures Procedures (including critical care time)  Medications Ordered in ED Medications - No data to display   Initial Impression / Assessment and Plan / ED Course  I have reviewed the triage vital signs and the nursing notes.  Pertinent labs & imaging results that were available during my care of  the patient were reviewed by me and considered in my medical decision making (see chart for details).     Pt presenting with intermittent sharp chest pain with SOB, and assoc anxiety. Chest pain is not likely of cardiac or pulmonary etiology d/t presentation, PERC negative, VSS, no tracheal deviation, no JVD or new murmur, RRR, breath sounds equal bilaterally, chest pain is reproducible, EKG without acute abnormalities, negative troponin, and negative CXR. Suspect anxiety as cause of symptoms. Patient is to be discharged with recommendation to follow up with PCP in regards to today's hospital visit.  Will provide rx for vistaril for anxiety.  Pt has been advised to return to the ED if CP becomes exertional, associated with diaphoresis or nausea, radiates to left jaw/arm, worsens or becomes concerning in any way. Pt appears reliable for follow up and is agreeable to discharge.  Discussed results, findings, treatment and follow up. Patient advised of return precautions. Patient verbalized understanding and agreed with plan.  Final Clinical Impressions(s) / ED Diagnoses   Final diagnoses:  Chest wall pain    ED  Discharge Orders        Ordered    hydrOXYzine (ATARAX/VISTARIL) 25 MG tablet  Every 6 hours PRN     09/04/17 1326       Drevon Plog, [place] N, PA-C 09/04/17 1452    Cathren Laine, MD 09/04/17 1542

## 2017-09-04 NOTE — Discharge Instructions (Signed)
Please read instructions below.  You can take vistaril every 6 hours as needed for anxiety. Do not drive while taking this medication. Follow up with your primary care provider about your visit today. Return to the ER for new or worsening symptoms; including worsening chest pain, shortness of breath, pain that radiates to the arm or neck, pain or shortness of breath worsened with exertion.

## 2017-09-04 NOTE — ED Triage Notes (Signed)
Pt states for one week now she has had substernal chest pressure with sob. Pt denies any other symptoms. Pt is warm and dry. Breathing easily, no distress noted.

## 2017-10-31 ENCOUNTER — Other Ambulatory Visit: Payer: Self-pay

## 2017-10-31 ENCOUNTER — Encounter (HOSPITAL_COMMUNITY): Payer: Self-pay | Admitting: Emergency Medicine

## 2017-10-31 ENCOUNTER — Emergency Department (HOSPITAL_COMMUNITY): Payer: Medicaid Other

## 2017-10-31 ENCOUNTER — Emergency Department (HOSPITAL_COMMUNITY)
Admission: EM | Admit: 2017-10-31 | Discharge: 2017-10-31 | Disposition: A | Discharge status: Home / Self Care | Payer: Medicaid Other | Attending: Emergency Medicine | Admitting: Emergency Medicine

## 2017-10-31 DIAGNOSIS — J111 Influenza due to unidentified influenza virus with other respiratory manifestations: Secondary | ICD-10-CM | POA: Insufficient documentation

## 2017-10-31 DIAGNOSIS — R6889 Other general symptoms and signs: Secondary | ICD-10-CM

## 2017-10-31 DIAGNOSIS — F17290 Nicotine dependence, other tobacco product, uncomplicated: Secondary | ICD-10-CM | POA: Insufficient documentation

## 2017-10-31 DIAGNOSIS — G43109 Migraine with aura, not intractable, without status migrainosus: Secondary | ICD-10-CM | POA: Diagnosis not present

## 2017-10-31 DIAGNOSIS — J029 Acute pharyngitis, unspecified: Secondary | ICD-10-CM | POA: Diagnosis present

## 2017-10-31 MED ORDER — DIPHENHYDRAMINE HCL 50 MG/ML IJ SOLN
12.5000 mg | Freq: Once | INTRAMUSCULAR | Status: AC
  Administered 2017-10-31: 12.5 mg via INTRAVENOUS
  Filled 2017-10-31: qty 1

## 2017-10-31 MED ORDER — METOCLOPRAMIDE HCL 5 MG/ML IJ SOLN
10.0000 mg | Freq: Once | INTRAMUSCULAR | Status: AC
  Administered 2017-10-31: 10 mg via INTRAVENOUS
  Filled 2017-10-31: qty 2

## 2017-10-31 MED ORDER — SODIUM CHLORIDE 0.9 % IV BOLUS (SEPSIS)
500.0000 mL | Freq: Once | INTRAVENOUS | Status: AC
  Administered 2017-10-31: 500 mL via INTRAVENOUS

## 2017-10-31 MED ORDER — PROMETHAZINE-DM 6.25-15 MG/5ML PO SYRP: 5.0000 mL | ORAL_SOLUTION | Freq: Four times a day (QID) | ORAL | 0 refills | Status: DC | PRN

## 2017-10-31 MED ORDER — ALBUTEROL SULFATE (2.5 MG/3ML) 0.083% IN NEBU: 2.5000 mg | INHALATION_SOLUTION | Freq: Four times a day (QID) | RESPIRATORY_TRACT | 12 refills | Status: DC | PRN

## 2017-10-31 NOTE — ED Notes (Signed)
Pt ambulated in hallway without difficulty

## 2017-10-31 NOTE — Discharge Instructions (Signed)
As discussed, make sure that you drink plenty of fluids and remain well-hydrated.  Drink tea with honey, cough drops, throat sprays over-the-counter to help soothe your throat.  Thank you cough medication as needed at nighttime.  Viral infections can take 2 weeks to clear.  You should be starting to improve over the next week. Albuterol nebulizer every 6 hours as needed.   Your chest x-ray did not show any evidence of pneumonia today.  Follow-up with your primary care provider early next week.  Return sooner if symptoms worsen or new concerning symptoms in the meantime.

## 2017-10-31 NOTE — ED Triage Notes (Signed)
Patient presents to ED for assessment of generalized body aches, headaches, nasal congestion, cough with green phlegm, fevers at night, and general malaise since Monday.

## 2017-10-31 NOTE — ED Provider Notes (Signed)
[facility] EMERGENCY DEPARTMENT Provider Note   CSN: [id number] Arrival date & time: 10/31/17  1155     History   Chief Complaint Chief Complaint  Patient presents with  . Flu Like Symptoms    HPI Norma Moreno is a 37 y.o. female with past medical history significant for asthma, depression, migraine with aura presenting with 5 days of flulike symptoms including sore throat, cough, body aches, headache.  Patient has known ill contacts with her children diagnosed with flu.  She has tried Alka-Seltzer and Tylenol without relief.  Reports that all her symptoms started at once suddenly on Monday and have been persistent and worsening over the last few days.  She reports a history of right-sided migraine with photophobia and aura of visual spots just prior to the headache.  Her current headache is exactly as previously experienced no new symptoms. She denies fever, chills, nausea, vomiting, diarrhea, abdominal pain, dizziness, blurred vision, or loss of balance.  HPI  Past Medical History:  Diagnosis Date  . Arthritis   . Asthma   . Back pain   . Depression   . Psychosis (HCC)   . Sickle cell trait (HCC)   . Spinal stenosis   . Tobacco abuse     Patient Active Problem List   Diagnosis Date Noted  . Severe episode of recurrent major depressive disorder, with psychotic features (HCC)   . MDD (major depressive disorder), recurrent, severe, with psychosis (HCC) 05/20/2017  . Acute pyelonephritis 03/08/2017  . AKI (acute kidney injury) (HCC) 03/08/2017  . Thrombocytopenia (HCC) 03/08/2017  . Diarrhea 03/08/2017  . Abdominal pain 03/08/2017  . Pyelonephritis 03/08/2017  . Asthma   . Tobacco abuse     Past Surgical History:  Procedure Laterality Date  . CESAREAN SECTION     x 4  . CHOLECYSTECTOMY      OB History    No data available       Home Medications    Prior to Admission medications   Medication Sig Start Date End Date Taking? Authorizing  Provider  acetaminophen (TYLENOL) 500 MG tablet Take 1,000 mg by mouth every 6 (six) hours as needed for mild pain.    [provider]  albuterol (PROVENTIL) (2.5 MG/3ML) 0.083% nebulizer solution Take 3 mLs (2.5 mg total) by nebulization every 6 (six) hours as needed for wheezing or shortness of breath. 10/31/17   Mathews Robinsons B, PA-C  ARIPiprazole (ABILIFY) 15 MG tablet Take 0.5 tablets (7.5 mg total) by mouth at bedtime. 05/25/17   Oneta Rack, NP  citalopram (CELEXA) 10 MG tablet Take 1 tablet (10 mg total) by mouth daily. 05/26/17   Oneta Rack, NP  cyclobenzaprine (FLEXERIL) 10 MG tablet Take 1 tablet (10 mg total) by mouth 2 (two) times daily as needed for muscle spasms. 08/05/17   McDonald, Mia A, PA-C  hydrOXYzine (ATARAX/VISTARIL) 25 MG tablet Take 1 tablet (25 mg total) by mouth every 6 (six) hours as needed for anxiety. 09/04/17   Robinson, [place] N, PA-C  ibuprofen (ADVIL,MOTRIN) 200 MG tablet Take 400 mg by mouth every 6 (six) hours as needed for mild pain.    [provider]  nicotine (NICODERM CQ - DOSED IN MG/24 HOURS) 21 mg/24hr patch Place 1 patch (21 mg total) onto the skin daily. Patient not taking: Reported on 08/11/2017 05/26/17   Oneta Rack, NP  promethazine-dextromethorphan (PROMETHAZINE-DM) 6.25-15 MG/5ML syrup Take 5 mLs by mouth 4 (four) times daily as needed for  cough. 10/31/17   Mathews Robinsons B, PA-C  traZODone (DESYREL) 50 MG tablet Take 1 tablet (50 mg total) by mouth at bedtime as needed for sleep. 05/25/17   Oneta Rack, NP    Family History Family History  Problem Relation Age of Onset  . Kidney Stones Father     Social History Social History   Tobacco Use  . Smoking status: Current Every Day Smoker    Types: Cigars  . Smokeless tobacco: Never Used  Substance Use Topics  . Alcohol use: No  . Drug use: No     Allergies   Patient has no known allergies.   Review of Systems Review of Systems  Constitutional:  Negative for chills, diaphoresis and fever.  HENT: Positive for congestion, ear pain and sore throat.   Eyes: Positive for photophobia. Negative for pain, discharge and redness.       Reporting that she typically has a aura of spots in her vision which is what she experienced this time as well.  Respiratory: Positive for cough. Negative for choking, chest tightness, shortness of breath and stridor.   Cardiovascular: Negative for chest pain, palpitations and leg swelling.  Gastrointestinal: Negative for abdominal distention, abdominal pain, diarrhea, nausea and vomiting.  Genitourinary: Negative for difficulty urinating, dysuria, flank pain and hematuria.  Musculoskeletal: Positive for myalgias. Negative for arthralgias, gait problem and neck stiffness.       Generalized myalgias  Skin: Negative for color change, pallor, rash and wound.  Neurological: Negative for dizziness, weakness, light-headedness and headaches.  Psychiatric/Behavioral: Negative for behavioral problems.     Physical Exam Updated Vital Signs BP 129/78 (BP Location: Right Arm)   Pulse 84   Temp 98.7 F (37.1 C) (Oral)   Resp 20   LMP 10/22/2017 (Approximate)   SpO2 100%   Physical Exam  Constitutional: She is oriented to person, place, and time. She appears well-developed and well-nourished. No distress.  Afebrile, nontoxic-appearing, speaking very quietly, no acute distress.  HENT:  Head: Normocephalic and atraumatic.  Right Ear: External ear normal.  Left Ear: External ear normal.  Nose: Nose normal.  Mouth/Throat: Oropharynx is clear and moist. No oropharyngeal exudate.  Tympanic membranes bilaterally  Eyes: Conjunctivae and EOM are normal. Pupils are equal, round, and reactive to light. Right eye exhibits no discharge. Left eye exhibits no discharge. No scleral icterus.  Both pupils are round and reactive to light.  Patient is experiencing photophobia on the right side.  Neck: Normal range of motion. Neck  supple.  No meningeal signs  Cardiovascular: Normal rate, regular rhythm, normal heart sounds and intact distal pulses.  No murmur heard. Pulmonary/Chest: Effort normal and breath sounds normal. No stridor. No respiratory distress. She has no wheezes. She has no rales.  Lungs are clear to auscultation bilaterally  Abdominal: She exhibits no distension.  Musculoskeletal: Normal range of motion. She exhibits no edema.  Lymphadenopathy:    She has cervical adenopathy.  Neurological: She is alert and oriented to person, place, and time. No cranial nerve deficit or sensory deficit. She exhibits normal muscle tone. Coordination normal.  Neurologic Exam:  - Mental status: Patient is alert and cooperative. Fluent speech and words are clear. Coherent thought processes and insight is good. Patient is oriented x 4 to person, place, time and event.  - Cranial nerves:  CN III, IV, VI: pupils equally round, reactive to light both direct and conscensual and normal accommodation. Full extra-ocular movement. CN V: motor temporalis and masseter strength  intact. CN VII : muscles of facial expression intact. CN X :  midline uvula. XI strength of sternocleidomastoid and trapezius muscles 5/5, XII: tongue is midline when protruded. - Motor: No involuntary movements. Muscle tone and bulk normal throughout. Muscle strength is 5/5 in bilateral shoulder abduction, elbow flexion and extension, grip, hip extension, flexion, leg flexion and extension, ankle dorsiflexion and plantar flexion.  - Sensory:  light tough sensation intact in all extremities.  - Cerebellar: rapid alternating movements and point to point movement intact in upper and lower extremities. Normal stance and gait.  Skin: Skin is warm and dry. No rash noted. She is not diaphoretic. No erythema. No pallor.  Psychiatric: She has a normal mood and affect.  Nursing note and vitals reviewed.    ED Treatments / Results  Labs (all labs ordered are listed, but  only abnormal results are displayed) Labs Reviewed - No data to display  EKG  EKG Interpretation None       Radiology Dg Chest 2 View  Result Date: 10/31/2017 CLINICAL DATA:  Cough EXAM: CHEST - 2 VIEW COMPARISON:  09/04/2017 FINDINGS: Heart and mediastinal contours are within normal limits. No focal opacities or effusions. No acute bony abnormality. IMPRESSION: No active cardiopulmonary disease. Electronically Signed   By: Charlett NoseKevin  Dover M.D.   On: 10/31/2017 12:35    Procedures Procedures (including critical care time)  Medications Ordered in ED Medications  diphenhydrAMINE (BENADRYL) injection 12.5 mg (not administered)  metoCLOPramide (REGLAN) injection 10 mg (not administered)  sodium chloride 0.9 % bolus 500 mL (not administered)     Initial Impression / Assessment and Plan / ED Course  I have reviewed the triage vital signs and the nursing notes.  Pertinent labs & imaging results that were available during my care of the patient were reviewed by me and considered in my medical decision making (see chart for details).    Patient presenting with 5 days of flulike symptoms with known ill contacts and positive diagnosis for flu with her children. Patient did not receive flu immunization this year. Afebrile, nontoxic-appearing lungs clear to auscultation bilaterally, no meningeal signs. Oropharynx is clear and without exudate, patient is tolerating oral secretions well. Normal neuro.  Her headache is exactly the same as what she has previously experiences migraines. Will give migraine cocktail and reassess.  On reassessment, patient reported significant improvement. No headache or photophobia on exam. She reported feeling very sleepy from the medication. Patient has someone taking her home today. She is not driving.  Ambulated without difficulties prior to discharge.  X-ray negative for acute infiltrate, or evidence of pneumonia. Good air movement on exam, no  wheezing.  Will discharge patient home with symptomatic relief and close follow-up with PCP.  Discussed strict return precautions and advised to return to the emergency department if experiencing any new or worsening symptoms. Instructions were understood and patient agreed with discharge plan. Final Clinical Impressions(s) / ED Diagnoses   Final diagnoses:  Flu-like symptoms  Migraine with aura and without status migrainosus, not intractable    ED Discharge Orders        Ordered    promethazine-dextromethorphan (PROMETHAZINE-DM) 6.25-15 MG/5ML syrup  4 times daily PRN     10/31/17 1350    albuterol (PROVENTIL) (2.5 MG/3ML) 0.083% nebulizer solution  Every 6 hours PRN     10/31/17 1350       Gregary CromerMitchell, Jessica B, PA-C 10/31/17 1538    Arby BarrettePfeiffer, Marcy, MD 11/01/17 1034

## 2017-10-31 NOTE — ED Notes (Signed)
Patient is alert and orientedx4.  Patient was explained discharge instructions and they understood them with no questions.  The patient's husband is taking her home. 

## 2017-11-02 ENCOUNTER — Ambulatory Visit (HOSPITAL_COMMUNITY)
Admission: EM | Admit: 2017-11-02 | Discharge: 2017-11-02 | Disposition: A | Discharge status: Home / Self Care | Payer: Medicaid Other

## 2017-11-02 NOTE — ED Notes (Signed)
Per pt access, pt LWBS 

## 2017-12-30 ENCOUNTER — Ambulatory Visit (HOSPITAL_COMMUNITY)
Admission: EM | Admit: 2017-12-30 | Discharge: 2017-12-30 | Disposition: A | Discharge status: Home / Self Care | Payer: Medicaid Other | Attending: Family Medicine | Admitting: Family Medicine

## 2017-12-30 ENCOUNTER — Encounter (HOSPITAL_COMMUNITY): Payer: Self-pay

## 2017-12-30 DIAGNOSIS — M25552 Pain in left hip: Secondary | ICD-10-CM | POA: Diagnosis not present

## 2017-12-30 DIAGNOSIS — M7989 Other specified soft tissue disorders: Secondary | ICD-10-CM

## 2017-12-30 MED ORDER — HYDROCODONE-ACETAMINOPHEN 5-325 MG PO TABS: 1.0000 | ORAL_TABLET | Freq: Four times a day (QID) | ORAL | 0 refills | Status: DC | PRN

## 2017-12-30 MED ORDER — KETOROLAC TROMETHAMINE 60 MG/2ML IM SOLN
60.0000 mg | Freq: Once | INTRAMUSCULAR | Status: AC
  Administered 2017-12-30: 60 mg via INTRAMUSCULAR

## 2017-12-30 MED ORDER — KETOROLAC TROMETHAMINE 60 MG/2ML IM SOLN
INTRAMUSCULAR | Status: AC
  Filled 2017-12-30: qty 2

## 2017-12-30 NOTE — ED Triage Notes (Signed)
Pt presents with complaints of pain in her left hip radiating down to her left leg.  Denies any injury.

## 2017-12-30 NOTE — Discharge Instructions (Signed)
Please go to the main entrance of [facility] tomorrow at 1245, ask for the vascular lab.  Your appointment is at 1:00.  We gave you a shot of Toradol today, please continue to use Tylenol and ibuprofen for your pain.  I have provided you with a couple hydrocodone, you may take these at night to help with sleep.  Follow-up with your PCP tomorrow as planned for further pain management.  Please elevate your leg when you are at home.  Please go to emergency room if you develop worsening swelling, pain, chest pain, shortness of breath or difficulty breathing.

## 2017-12-30 NOTE — ED Provider Notes (Signed)
[facility]    CSN: [id number] Arrival date & time: 12/30/17  1517     History   Chief Complaint Chief Complaint  Patient presents with  . Hip Pain    HPI Norma Moreno is a 37 y.o. female hx of tobacco use, chronic back pain, asthma presenting today for evaluation of left hip pain and left leg swelling. Symptoms began last night around 7 oclock, patient states she has chronic hip pain, but the pain in leg and ankle are new and what came on all of a sudden last night.  She is also felt like she has had some swelling that extends all the way from her ankle all the way to her thighs.  Denies any increase in activity or injury.  Is having some mild tingling to the dorsum of her left foot, denies numbness.  Denies any loss of bowel or bladder control.  Denies any saddle anesthesia.  HPI  Past Medical History:  Diagnosis Date  . Arthritis   . Asthma   . Back pain   . Depression   . Psychosis (HCC)   . Sickle cell trait (HCC)   . Spinal stenosis   . Tobacco abuse     Patient Active Problem List   Diagnosis Date Noted  . Severe episode of recurrent major depressive disorder, with psychotic features (HCC)   . MDD (major depressive disorder), recurrent, severe, with psychosis (HCC) 05/20/2017  . Acute pyelonephritis 03/08/2017  . AKI (acute kidney injury) (HCC) 03/08/2017  . Thrombocytopenia (HCC) 03/08/2017  . Diarrhea 03/08/2017  . Abdominal pain 03/08/2017  . Pyelonephritis 03/08/2017  . Asthma   . Tobacco abuse     Past Surgical History:  Procedure Laterality Date  . CESAREAN SECTION     x 4  . CHOLECYSTECTOMY      OB History   None      Home Medications    Prior to Admission medications   Medication Sig Start Date End Date Taking? Authorizing Provider  acetaminophen (TYLENOL) 500 MG tablet Take 1,000 mg by mouth every 6 (six) hours as needed for mild pain.    [provider]  albuterol (PROVENTIL) (2.5 MG/3ML) 0.083% nebulizer solution  Take 3 mLs (2.5 mg total) by nebulization every 6 (six) hours as needed for wheezing or shortness of breath. 10/31/17   Mathews Robinsons B, PA-C  ARIPiprazole (ABILIFY) 15 MG tablet Take 0.5 tablets (7.5 mg total) by mouth at bedtime. 05/25/17   Oneta Rack, NP  citalopram (CELEXA) 10 MG tablet Take 1 tablet (10 mg total) by mouth daily. 05/26/17   Oneta Rack, NP  cyclobenzaprine (FLEXERIL) 10 MG tablet Take 1 tablet (10 mg total) by mouth 2 (two) times daily as needed for muscle spasms. 08/05/17   McDonald, Mia A, PA-C  HYDROcodone-acetaminophen (NORCO/VICODIN) 5-325 MG tablet Take 1 tablet by mouth every 6 (six) hours as needed. 12/30/17   Latricia Cerrito C, PA-C  hydrOXYzine (ATARAX/VISTARIL) 25 MG tablet Take 1 tablet (25 mg total) by mouth every 6 (six) hours as needed for anxiety. 09/04/17   Robinson, [place] N, PA-C  ibuprofen (ADVIL,MOTRIN) 200 MG tablet Take 400 mg by mouth every 6 (six) hours as needed for mild pain.    [provider]  nicotine (NICODERM CQ - DOSED IN MG/24 HOURS) 21 mg/24hr patch Place 1 patch (21 mg total) onto the skin daily. Patient not taking: Reported on 08/11/2017 05/26/17   Oneta Rack, NP  promethazine-dextromethorphan (PROMETHAZINE-DM) 6.25-15 MG/5ML  syrup Take 5 mLs by mouth 4 (four) times daily as needed for cough. 10/31/17   Georgiana Shore, PA-C  traZODone (DESYREL) 50 MG tablet Take 1 tablet (50 mg total) by mouth at bedtime as needed for sleep. 05/25/17   Oneta Rack, NP    Family History Family History  Problem Relation Age of Onset  . Kidney Stones Father     Social History Social History   Tobacco Use  . Smoking status: Current Every Day Smoker    Types: Cigars  . Smokeless tobacco: Never Used  Substance Use Topics  . Alcohol use: No  . Drug use: No     Allergies   Patient has no known allergies.   Review of Systems Review of Systems  Constitutional: Negative for activity change and appetite change.  HENT:  Negative for trouble swallowing.   Eyes: Negative for pain and visual disturbance.  Respiratory: Negative for shortness of breath.   Cardiovascular: Positive for leg swelling. Negative for chest pain.  Gastrointestinal: Negative for abdominal pain, nausea and vomiting.  Musculoskeletal: Positive for arthralgias, back pain, gait problem and myalgias. Negative for neck pain and neck stiffness.  Skin: Negative for color change and wound.  Neurological: Negative for dizziness, seizures, syncope, weakness, light-headedness, numbness and headaches.     Physical Exam Triage Vital Signs ED Triage Vitals  Enc Vitals Group     BP 12/30/17 1528 115/73     Pulse Rate 12/30/17 1528 82     Resp 12/30/17 1528 16     Temp 12/30/17 1528 98.1 F (36.7 C)     Temp Source 12/30/17 1528 Oral     SpO2 12/30/17 1528 100 %     Weight 12/30/17 1529 171 lb (77.6 kg)     Height 12/30/17 1529  (1.626 m)     Head Circumference --      Peak Flow --      Pain Score 12/30/17 1532 5     Pain Loc --      Pain Edu? --      Excl. in GC? --    No data found.  Updated Vital Signs BP 115/73 (BP Location: Right Arm)   Pulse 82   Temp 98.1 F (36.7 C) (Oral)   Resp 16   Ht  (1.626 m)   Wt 171 lb (77.6 kg)   LMP 12/19/2017   SpO2 100%   BMI 29.35 kg/m   Visual Acuity Right Eye Distance:   Left Eye Distance:   Bilateral Distance:    Right Eye Near:   Left Eye Near:    Bilateral Near:     Physical Exam  Constitutional: She appears well-developed and well-nourished. No distress.  HENT:  Head: Normocephalic and atraumatic.  Eyes: Conjunctivae are normal.  Neck: Neck supple.  Cardiovascular: Normal rate and regular rhythm.  No murmur heard. Pulmonary/Chest: Effort normal and breath sounds normal. No respiratory distress.  Abdominal: Soft. There is no tenderness.  Musculoskeletal: She exhibits no edema.  Right leg: Foot- 22.5, distal calf 24.5 cm, tibial tubercle-36.5 cm, distal thigh-45  cm  Left leg: Foot-24 cm, distal calf 25.5 cm, tibial tubercle-38 cm, distal thigh 46 cm.  No overlying erythema to left leg, tenderness to palpation of calf and thigh, dorsalis pedis 2+, cap refill less than 2 seconds  Tenderness to lower lumbar/upper sacral area midline, patient unable to tolerate straight leg raise  Neurological: She is alert.  Skin: Skin is warm and dry.  Psychiatric: She has a normal mood and affect.  Nursing note and vitals reviewed.    UC Treatments / Results  Labs (all labs ordered are listed, but only abnormal results are displayed) Labs Reviewed - No data to display  EKG None  Radiology No results found.  Procedures Procedures (including critical care time)  Medications Ordered in UC Medications  ketorolac (TORADOL) injection 60 mg (60 mg Intramuscular Given 12/30/17 1615)    Initial Impression / Assessment and Plan / UC Course  I have reviewed the triage vital signs and the nursing notes.  Pertinent labs & imaging results that were available during my care of the patient were reviewed by me and considered in my medical decision making (see chart for details).     Patient with unilateral leg swelling, mild, not obvious to I but swelling present with measurements.  Pain may be related to her chronic back pain as she has spinal stenosis, possible sciatica but given her history of smoking and unilateral leg swelling with pain and acute onset, will obtain ultrasound of lower extremity to rule out DVT.  Appointment tomorrow at 1:00.  Patient has appointment with PCP tomorrow at 4:00.  Will provide Toradol injection today in clinic, ibuprofen and Tylenol for further pain management.  Provided a few hydrocodone to use for pain at nighttime.  Advised to go to emergency room if she develops worsening pain or swelling, shortness of breath or difficulty breathing or chest pain. Discussed strict return precautions. Patient verbalized understanding and is agreeable  with plan.  Final Clinical Impressions(s) / UC Diagnoses   Final diagnoses:  Pain of left hip joint  Left leg swelling     Discharge Instructions     Please go to the main entrance of [facility] tomorrow at 1245, ask for the vascular lab.  Your appointment is at 1:00.  We gave you a shot of Toradol today, please continue to use Tylenol and ibuprofen for your pain.  I have provided you with a couple hydrocodone, you may take these at night to help with sleep.  Follow-up with your PCP tomorrow as planned for further pain management.  Please elevate your leg when you are at home.  Please go to emergency room if you develop worsening swelling, pain, chest pain, shortness of breath or difficulty breathing.    ED Prescriptions    Medication Sig Dispense Auth. Provider   HYDROcodone-acetaminophen (NORCO/VICODIN) 5-325 MG tablet Take 1 tablet by mouth every 6 (six) hours as needed. 6 tablet Kayse Puccini, [place] C, PA-C     Controlled Substance Prescriptions [place] Controlled Substance Registry consulted? Yes benefit> risk   Lew Dawes, [place] 12/30/17 1651

## 2017-12-31 ENCOUNTER — Encounter (HOSPITAL_COMMUNITY): Payer: Self-pay

## 2018-01-13 ENCOUNTER — Emergency Department (HOSPITAL_COMMUNITY): Payer: Medicaid Other

## 2018-01-13 ENCOUNTER — Other Ambulatory Visit: Payer: Self-pay

## 2018-01-13 ENCOUNTER — Encounter (HOSPITAL_COMMUNITY): Payer: Self-pay | Admitting: Emergency Medicine

## 2018-01-13 ENCOUNTER — Emergency Department (HOSPITAL_COMMUNITY)
Admission: EM | Admit: 2018-01-13 | Discharge: 2018-01-13 | Disposition: A | Discharge status: Home / Self Care | Payer: Medicaid Other | Attending: Emergency Medicine | Admitting: Emergency Medicine

## 2018-01-13 DIAGNOSIS — Z79899 Other long term (current) drug therapy: Secondary | ICD-10-CM | POA: Insufficient documentation

## 2018-01-13 DIAGNOSIS — R112 Nausea with vomiting, unspecified: Secondary | ICD-10-CM | POA: Diagnosis present

## 2018-01-13 DIAGNOSIS — N390 Urinary tract infection, site not specified: Secondary | ICD-10-CM | POA: Diagnosis not present

## 2018-01-13 DIAGNOSIS — B349 Viral infection, unspecified: Secondary | ICD-10-CM

## 2018-01-13 DIAGNOSIS — J45909 Unspecified asthma, uncomplicated: Secondary | ICD-10-CM | POA: Insufficient documentation

## 2018-01-13 DIAGNOSIS — F17290 Nicotine dependence, other tobacco product, uncomplicated: Secondary | ICD-10-CM | POA: Diagnosis not present

## 2018-01-13 LAB — CBC WITH DIFFERENTIAL/PLATELET
BASOS ABS: 0 10*3/uL (ref 0.0–0.1)
BASOS PCT: 1 %
EOS ABS: 0.3 10*3/uL (ref 0.0–0.7)
EOS PCT: 5 %
HCT: 34.9 % — ABNORMAL LOW (ref 36.0–46.0)
Hemoglobin: 11.8 g/dL — ABNORMAL LOW (ref 12.0–15.0)
Lymphocytes Relative: 42 %
Lymphs Abs: 2.4 10*3/uL (ref 0.7–4.0)
MCH: 26.9 pg (ref 26.0–34.0)
MCHC: 33.8 g/dL (ref 30.0–36.0)
MCV: 79.7 fL (ref 78.0–100.0)
MONO ABS: 0.4 10*3/uL (ref 0.1–1.0)
Monocytes Relative: 7 %
Neutro Abs: 2.6 10*3/uL (ref 1.7–7.7)
Neutrophils Relative %: 45 %
PLATELETS: 198 10*3/uL (ref 150–400)
RBC: 4.38 MIL/uL (ref 3.87–5.11)
RDW: 16.2 % — AB (ref 11.5–15.5)
WBC: 5.8 10*3/uL (ref 4.0–10.5)

## 2018-01-13 LAB — I-STAT CHEM 8, ED
BUN: <3 mg/dL — ABNORMAL LOW (ref 6–20)
Calcium, Ion: 1.13 mmol/L — ABNORMAL LOW (ref 1.15–1.40)
Chloride: 109 mmol/L (ref 101–111)
Creatinine, Ser: 1 mg/dL (ref 0.44–1.00)
Glucose, Bld: 79 mg/dL (ref 65–99)
HEMATOCRIT: 37 % (ref 36.0–46.0)
Hemoglobin: 12.6 g/dL (ref 12.0–15.0)
POTASSIUM: 3.2 mmol/L — AB (ref 3.5–5.1)
SODIUM: 142 mmol/L (ref 135–145)
TCO2: 19 mmol/L — AB (ref 22–32)

## 2018-01-13 LAB — URINALYSIS, ROUTINE W REFLEX MICROSCOPIC
Bilirubin Urine: NEGATIVE
GLUCOSE, UA: NEGATIVE mg/dL
HGB URINE DIPSTICK: NEGATIVE
Ketones, ur: NEGATIVE mg/dL
Nitrite: NEGATIVE
PH: 6 (ref 5.0–8.0)
PROTEIN: NEGATIVE mg/dL
SPECIFIC GRAVITY, URINE: 1.01 (ref 1.005–1.030)

## 2018-01-13 LAB — I-STAT BETA HCG BLOOD, ED (MC, WL, AP ONLY)

## 2018-01-13 MED ORDER — SODIUM CHLORIDE 0.9 % IV BOLUS
1000.0000 mL | Freq: Once | INTRAVENOUS | Status: AC
  Administered 2018-01-13: 1000 mL via INTRAVENOUS

## 2018-01-13 MED ORDER — ONDANSETRON HCL 4 MG PO TABS: 4.0000 mg | ORAL_TABLET | Freq: Four times a day (QID) | ORAL | 0 refills | Status: DC

## 2018-01-13 MED ORDER — NITROFURANTOIN MONOHYD MACRO 100 MG PO CAPS
100.0000 mg | ORAL_CAPSULE | Freq: Once | ORAL | Status: AC
  Administered 2018-01-13: 100 mg via ORAL
  Filled 2018-01-13: qty 1

## 2018-01-13 MED ORDER — POTASSIUM CHLORIDE CRYS ER 20 MEQ PO TBCR
40.0000 meq | EXTENDED_RELEASE_TABLET | Freq: Once | ORAL | Status: AC
  Administered 2018-01-13: 40 meq via ORAL
  Filled 2018-01-13: qty 2

## 2018-01-13 MED ORDER — ONDANSETRON HCL 4 MG/2ML IJ SOLN
4.0000 mg | Freq: Once | INTRAMUSCULAR | Status: AC
  Administered 2018-01-13: 4 mg via INTRAVENOUS
  Filled 2018-01-13: qty 2

## 2018-01-13 MED ORDER — NITROFURANTOIN MONOHYD MACRO 100 MG PO CAPS: 100.0000 mg | ORAL_CAPSULE | Freq: Two times a day (BID) | ORAL | 0 refills | Status: DC

## 2018-01-13 MED ORDER — KETOROLAC TROMETHAMINE 30 MG/ML IJ SOLN
30.0000 mg | Freq: Once | INTRAMUSCULAR | Status: AC
  Administered 2018-01-13: 30 mg via INTRAVENOUS
  Filled 2018-01-13: qty 1

## 2018-01-13 NOTE — ED Notes (Signed)
Patient made aware urine sample is needed. 

## 2018-01-13 NOTE — Discharge Instructions (Addendum)
Take Macrobid as prescribed until all gone.  Take Tylenol for pain and fever.  Drink plenty of fluids.  Zofran for nausea and vomiting as needed.  Rest.  Follow-up with family doctor in 3 days for recheck.  Return if worsening

## 2018-01-13 NOTE — ED Triage Notes (Signed)
Per EMS pt complaint of flu like symptoms weakness, n/v, chills, and fever x2 days. Given 350 ml normal saline bolus en route with EMS.

## 2018-01-13 NOTE — ED Provider Notes (Signed)
[facility]-EMERGENCY DEPT Provider Note   CSN: [id number] Arrival date & time: 01/13/18  1210     History   Chief Complaint Chief Complaint  Patient presents with  . Flu Like Symptoms    HPI Norma Moreno is a 37 y.o female.  HPI Norma Moreno is a 37 y.o. female with history of asthma, depression, psychosis, pyelonephritis, acute kidney injury, presents to emergency department with complaint of nausea, vomiting, diarrhea, cough, generalized weakness.  She states that her symptoms began 2 days ago.  She states that she has not been able to eat anything in 2 days.  Last episode of emesis just prior to arrival, last episode of diarrhea was this morning.  Reports generalized abdominal pain.  Reports nonproductive cough.  She has been using inhaler which has not helped.  She has also take the Excedrin which has not helped her headache.  States she feels weak and tired.  She does not believe she is pregnant.  She has had subjective fever at home.  No sick contacts.  No urinary symptoms.  No vaginal discharge.  No other complaints.  Past Medical History:  Diagnosis Date  . Arthritis   . Asthma   . Back pain   . Depression   . Psychosis (HCC)   . Sickle cell trait (HCC)   . Spinal stenosis   . Tobacco abuse     Patient Active Problem List   Diagnosis Date Noted  . Severe episode of recurrent major depressive disorder, with psychotic features (HCC)   . MDD (major depressive disorder), recurrent, severe, with psychosis (HCC) 05/20/2017  . Acute pyelonephritis 03/08/2017  . AKI (acute kidney injury) (HCC) 03/08/2017  . Thrombocytopenia (HCC) 03/08/2017  . Diarrhea 03/08/2017  . Abdominal pain 03/08/2017  . Pyelonephritis 03/08/2017  . Asthma   . Tobacco abuse     Past Surgical History:  Procedure Laterality Date  . CESAREAN SECTION     x 4  . CHOLECYSTECTOMY       OB History   None      Home Medications    Prior to Admission medications     Medication Sig Start Date End Date Taking? Authorizing Provider  acetaminophen (TYLENOL) 500 MG tablet Take 1,000 mg by mouth every 6 (six) hours as needed for mild pain.    [provider]  albuterol (PROVENTIL) (2.5 MG/3ML) 0.083% nebulizer solution Take 3 mLs (2.5 mg total) by nebulization every 6 (six) hours as needed for wheezing or shortness of breath. 10/31/17   Mathews Robinsons B, PA-C  ARIPiprazole (ABILIFY) 15 MG tablet Take 0.5 tablets (7.5 mg total) by mouth at bedtime. 05/25/17   Oneta Rack, NP  citalopram (CELEXA) 10 MG tablet Take 1 tablet (10 mg total) by mouth daily. 05/26/17   Oneta Rack, NP  cyclobenzaprine (FLEXERIL) 10 MG tablet Take 1 tablet (10 mg total) by mouth 2 (two) times daily as needed for muscle spasms. 08/05/17   McDonald, Mia A, PA-C  HYDROcodone-acetaminophen (NORCO/VICODIN) 5-325 MG tablet Take 1 tablet by mouth every 6 (six) hours as needed. 12/30/17   Wieters, Hallie C, PA-C  hydrOXYzine (ATARAX/VISTARIL) 25 MG tablet Take 1 tablet (25 mg total) by mouth every 6 (six) hours as needed for anxiety. 09/04/17   Robinson, [place] N, PA-C  ibuprofen (ADVIL,MOTRIN) 200 MG tablet Take 400 mg by mouth every 6 (six) hours as needed for mild pain.    [provider]  nicotine (NICODERM CQ - DOSED IN  MG/24 HOURS) 21 mg/24hr patch Place 1 patch (21 mg total) onto the skin daily. Patient not taking: Reported on 08/11/2017 05/26/17   Oneta Rack, NP  promethazine-dextromethorphan (PROMETHAZINE-DM) 6.25-15 MG/5ML syrup Take 5 mLs by mouth 4 (four) times daily as needed for cough. 10/31/17   Georgiana Shore, PA-C  traZODone (DESYREL) 50 MG tablet Take 1 tablet (50 mg total) by mouth at bedtime as needed for sleep. 05/25/17   Oneta Rack, NP    Family History Family History  Problem Relation Age of Onset  . Kidney Stones Father     Social History Social History   Tobacco Use  . Smoking status: Current Every Day Smoker    Types: Cigars  .  Smokeless tobacco: Never Used  Substance Use Topics  . Alcohol use: No  . Drug use: No     Allergies   Patient has no known allergies.   Review of Systems Review of Systems  Constitutional: Positive for chills, fatigue and fever.  HENT: Negative for congestion, sore throat and trouble swallowing.   Respiratory: Positive for cough. Negative for chest tightness and shortness of breath.   Cardiovascular: Negative for chest pain, palpitations and leg swelling.  Gastrointestinal: Positive for diarrhea, nausea and vomiting. Negative for abdominal pain.  Genitourinary: Negative for dysuria, flank pain, pelvic pain, vaginal bleeding, vaginal discharge and vaginal pain.  Musculoskeletal: Positive for myalgias. Negative for arthralgias, neck pain and neck stiffness.  Skin: Negative for rash.  Neurological: Positive for dizziness, weakness, light-headedness and headaches.  All other systems reviewed and are negative.    Physical Exam Updated Vital Signs BP 119/76 (BP Location: Right Arm)   Pulse 88   Temp 98.4 F (36.9 C) (Oral)   Resp 18   LMP 12/19/2017   SpO2 96%   Physical Exam  Constitutional: She is oriented to person, place, and time. She appears well-developed and well-nourished.  Laying in the stretcher with her eyes closed, speaking very quietly, refuses to move  HENT:  Head: Normocephalic.  Right Ear: External ear normal.  Left Ear: External ear normal.  Nose: Nose normal.  Mouth/Throat: Oropharynx is clear and moist.  Eyes: Conjunctivae are normal.  Neck: Normal range of motion. Neck supple.  No meningismus  Cardiovascular: Normal rate, regular rhythm and normal heart sounds.  Pulmonary/Chest: Effort normal and breath sounds normal. No respiratory distress. She has no wheezes. She has no rales.  Abdominal: Soft. Bowel sounds are normal. She exhibits no distension. There is no tenderness. There is no rebound.  Musculoskeletal: She exhibits no edema.  Neurological:  She is alert and oriented to person, place, and time.  Skin: Skin is warm and dry.  Psychiatric: She has a normal mood and affect. Her behavior is normal.  Nursing note and vitals reviewed.    ED Treatments / Results  Labs (all labs ordered are listed, but only abnormal results are displayed) Labs Reviewed  CBC WITH DIFFERENTIAL/PLATELET - Abnormal; Notable for the following components:      Result Value   Hemoglobin 11.8 (*)    HCT 34.9 (*)    RDW 16.2 (*)    All other components within normal limits  URINALYSIS, ROUTINE W REFLEX MICROSCOPIC - Abnormal; Notable for the following components:   Leukocytes, UA MODERATE (*)    Bacteria, UA RARE (*)    All other components within normal limits  I-STAT CHEM 8, ED - Abnormal; Notable for the following components:   Potassium 3.2 (*)  BUN <3 (*)    Calcium, Ion 1.13 (*)    TCO2 19 (*)    All other components within normal limits  I-STAT BETA HCG BLOOD, ED ([facility], [facility], [facility] ONLY)    EKG None  Radiology Dg Chest 2 View  Result Date: 01/13/2018 CLINICAL DATA:  Cough, fever EXAM: CHEST - 2 VIEW COMPARISON:  10/31/2017 FINDINGS: Heart and mediastinal contours are within normal limits. No focal opacities or effusions. No acute bony abnormality. IMPRESSION: No active cardiopulmonary disease. Electronically Signed   By: Charlett Nose M.D.   On: 01/13/2018 13:56    Procedures Procedures (including critical care time)  Medications Ordered in ED Medications  ondansetron (ZOFRAN) injection 4 mg (has no administration in time range)  ketorolac (TORADOL) 30 MG/ML injection 30 mg (has no administration in time range)  sodium chloride 0.9 % bolus 1,000 mL (has no administration in time range)     Initial Impression / Assessment and Plan / ED Course  I have reviewed the triage vital signs and the nursing notes.  Pertinent labs & imaging results that were available during my care of the patient were reviewed by me and considered in my medical  decision making (see chart for details).     37 year old female, here with nausea, vomiting, diarrhea, generalized weakness.  States she has not had any to eat in 2 days.  She does have history of pyelonephritis and acute kidney injury with dehydration.  I will check basic labs.  Will get a chest x-ray for cough to rule out pneumonia.  Will give more IV fluids and Zofran and Toradol for her symptoms.  Her vital signs are normal at this time.  No concerning exam findings.  2:31 PM Labs show slightly low potassium of 3.2, otherwise unremarkable.  Will give potassium 40 mEq prior to her leaving.  Urine concerning for possible infection with 11-20 white blood cells, rare bacteria, moderate leukocytes.  I will place her on Macrobid.  Patient feels better after IV fluids and Zofran.  She is able to drink, drinking ginger ale without vomiting.  Will discharge home with Zofran, Macrobid, close outpatient follow-up.  Instructed to keep up with her fluids, rest, follow-up as needed.  Return precautions discussed  Vitals:   01/13/18 1221  BP: 119/76  Pulse: 88  Resp: 18  Temp: 98.4 F (36.9 C)  TempSrc: Oral  SpO2: 96%     Final Clinical Impressions(s) / ED Diagnoses   Final diagnoses:  Viral syndrome  Urinary tract infection without hematuria, site unspecified    ED Discharge Orders        Ordered    ondansetron (ZOFRAN) 4 MG tablet  Every 6 hours     01/13/18 1433    nitrofurantoin, macrocrystal-monohydrate, (MACROBID) 100 MG capsule  2 times daily     01/13/18 1433       Jaynie Crumble, PA-C 01/13/18 1434    Doug Sou, MD 01/13/18 1521

## 2018-03-18 ENCOUNTER — Encounter (HOSPITAL_COMMUNITY): Payer: Self-pay

## 2018-03-18 ENCOUNTER — Emergency Department (HOSPITAL_COMMUNITY)
Admission: EM | Admit: 2018-03-18 | Discharge: 2018-03-19 | Disposition: A | Discharge status: Home / Self Care | Payer: Medicaid Other | Attending: Emergency Medicine | Admitting: Emergency Medicine

## 2018-03-18 ENCOUNTER — Emergency Department (HOSPITAL_COMMUNITY): Payer: Medicaid Other

## 2018-03-18 DIAGNOSIS — M79601 Pain in right arm: Secondary | ICD-10-CM | POA: Insufficient documentation

## 2018-03-18 DIAGNOSIS — Z79899 Other long term (current) drug therapy: Secondary | ICD-10-CM | POA: Diagnosis not present

## 2018-03-18 DIAGNOSIS — M7541 Impingement syndrome of right shoulder: Secondary | ICD-10-CM | POA: Diagnosis not present

## 2018-03-18 DIAGNOSIS — R200 Anesthesia of skin: Secondary | ICD-10-CM | POA: Diagnosis present

## 2018-03-18 DIAGNOSIS — J45909 Unspecified asthma, uncomplicated: Secondary | ICD-10-CM | POA: Insufficient documentation

## 2018-03-18 DIAGNOSIS — F17290 Nicotine dependence, other tobacco product, uncomplicated: Secondary | ICD-10-CM | POA: Insufficient documentation

## 2018-03-18 DIAGNOSIS — M5412 Radiculopathy, cervical region: Secondary | ICD-10-CM | POA: Diagnosis not present

## 2018-03-18 DIAGNOSIS — D573 Sickle-cell trait: Secondary | ICD-10-CM | POA: Diagnosis not present

## 2018-03-18 LAB — I-STAT CHEM 8, ED
BUN: 4 mg/dL — AB (ref 6–20)
CHLORIDE: 104 mmol/L (ref 98–111)
Calcium, Ion: 1.13 mmol/L — ABNORMAL LOW (ref 1.15–1.40)
Creatinine, Ser: 1.1 mg/dL — ABNORMAL HIGH (ref 0.44–1.00)
Glucose, Bld: 96 mg/dL (ref 70–99)
HEMATOCRIT: 33 % — AB (ref 36.0–46.0)
Hemoglobin: 11.2 g/dL — ABNORMAL LOW (ref 12.0–15.0)
Potassium: 3 mmol/L — ABNORMAL LOW (ref 3.5–5.1)
SODIUM: 142 mmol/L (ref 135–145)
TCO2: 23 mmol/L (ref 22–32)

## 2018-03-18 LAB — DIFFERENTIAL
ABS IMMATURE GRANULOCYTES: 0 10*3/uL (ref 0.0–0.1)
BASOS PCT: 1 %
Basophils Absolute: 0.1 10*3/uL (ref 0.0–0.1)
Eosinophils Absolute: 0.3 10*3/uL (ref 0.0–0.7)
Eosinophils Relative: 5 %
Immature Granulocytes: 0 %
Lymphocytes Relative: 55 %
Lymphs Abs: 3.5 10*3/uL (ref 0.7–4.0)
MONO ABS: 0.3 10*3/uL (ref 0.1–1.0)
MONOS PCT: 5 %
NEUTROS ABS: 2.2 10*3/uL (ref 1.7–7.7)
NEUTROS PCT: 34 %

## 2018-03-18 LAB — COMPREHENSIVE METABOLIC PANEL
ALK PHOS: 61 U/L (ref 38–126)
ALT: 9 U/L (ref 0–44)
AST: 14 U/L — AB (ref 15–41)
Albumin: 3.3 g/dL — ABNORMAL LOW (ref 3.5–5.0)
Anion gap: 7 (ref 5–15)
BUN: 6 mg/dL (ref 6–20)
CO2: 25 mmol/L (ref 22–32)
Calcium: 8.4 mg/dL — ABNORMAL LOW (ref 8.9–10.3)
Chloride: 108 mmol/L (ref 98–111)
Creatinine, Ser: 1.19 mg/dL — ABNORMAL HIGH (ref 0.44–1.00)
GFR, EST NON AFRICAN AMERICAN: 58 mL/min — AB (ref >60)
Glucose, Bld: 101 mg/dL — ABNORMAL HIGH (ref 70–99)
Potassium: 3 mmol/L — ABNORMAL LOW (ref 3.5–5.1)
Sodium: 140 mmol/L (ref 135–145)
Total Bilirubin: 0.4 mg/dL (ref 0.3–1.2)
Total Protein: 6.8 g/dL (ref 6.5–8.1)

## 2018-03-18 LAB — CBC
HCT: 31.6 % — ABNORMAL LOW (ref 36.0–46.0)
HEMOGLOBIN: 10.3 g/dL — AB (ref 12.0–15.0)
MCH: 26.9 pg (ref 26.0–34.0)
MCHC: 32.6 g/dL (ref 30.0–36.0)
MCV: 82.5 fL (ref 78.0–100.0)
Platelets: 230 10*3/uL (ref 150–400)
RBC: 3.83 MIL/uL — AB (ref 3.87–5.11)
RDW: 16.6 % — ABNORMAL HIGH (ref 11.5–15.5)
WBC: 6.4 10*3/uL (ref 4.0–10.5)

## 2018-03-18 LAB — I-STAT BETA HCG BLOOD, ED (MC, WL, AP ONLY): I-stat hCG, quantitative: <5 m[IU]/mL (ref <5)

## 2018-03-18 LAB — I-STAT TROPONIN, ED: Troponin i, poc: 0 ng/mL (ref 0.00–0.08)

## 2018-03-18 LAB — APTT: aPTT: 36 seconds (ref 24–36)

## 2018-03-18 LAB — PROTIME-INR
INR: 1.02
PROTHROMBIN TIME: 13.3 s (ref 11.4–15.2)

## 2018-03-18 NOTE — ED Triage Notes (Signed)
Pt states that for the past six months she has been having R arm numbness and weakness and pain that goes from the shoulder to her hand, pain comes and goes. This morning at 3am (LSN at 1am when she went to sleep) pain and numbness of the R arm woke her arm, R arm weakness and drift, no other neuro deficits. Reports migraines that happen everyday.

## 2018-03-19 LAB — CBG MONITORING, ED: Glucose-Capillary: 99 mg/dL (ref 70–99)

## 2018-03-19 MED ORDER — POTASSIUM CHLORIDE CRYS ER 20 MEQ PO TBCR
40.0000 meq | EXTENDED_RELEASE_TABLET | Freq: Once | ORAL | Status: AC
  Administered 2018-03-19: 40 meq via ORAL
  Filled 2018-03-19: qty 2

## 2018-03-19 NOTE — ED Notes (Signed)
Pt CBG was 99, notified Annie(RN)

## 2018-03-19 NOTE — ED Provider Notes (Signed)
[facility]   CSN: [id number] Arrival date & time: 03/18/18  2138     History   Chief Complaint Chief Complaint  Patient presents with  . Numbness    HPI Norma Moreno is a 37 y.o. female.  Patient with past medical history remarkable for psychosis presents to the emergency department with chief complaint of right arm pain and numbness.  She states that she has been having the symptoms for the past 5 months.  She complains of pain that radiates from her hand and wrist to her shoulder.  The pain is worsened with movement.  She denies any traumatic injuries.  Denies history of diabetes.  Denies any repetitive stressors.  Denies any neck pain or back pain.  She has not taking anything for her symptoms.  The history is provided by the patient. No language interpreter was used.    Past Medical History:  Diagnosis Date  . Arthritis   . Asthma   . Back pain   . Depression   . Psychosis (HCC)   . Sickle cell trait (HCC)   . Spinal stenosis   . Tobacco abuse     Patient Active Problem List   Diagnosis Date Noted  . Severe episode of recurrent major depressive disorder, with psychotic features (HCC)   . MDD (major depressive disorder), recurrent, severe, with psychosis (HCC) 05/20/2017  . Acute pyelonephritis 03/08/2017  . AKI (acute kidney injury) (HCC) 03/08/2017  . Thrombocytopenia (HCC) 03/08/2017  . Diarrhea 03/08/2017  . Abdominal pain 03/08/2017  . Pyelonephritis 03/08/2017  . Asthma   . Tobacco abuse     Past Surgical History:  Procedure Laterality Date  . CESAREAN SECTION     x 4  . CHOLECYSTECTOMY       OB History   None      Home Medications    Prior to Admission medications   Medication Sig Start Date End Date Taking? Authorizing Provider  albuterol (PROVENTIL) (2.5 MG/3ML) 0.083% nebulizer solution Take 3 mLs (2.5 mg total) by nebulization every 6 (six) hours as needed for wheezing or  shortness of breath. Patient not taking: Reported on 01/13/2018 10/31/17   Mathews RobinsonsMitchell, Jessica B, PA-C  ARIPiprazole (ABILIFY) 15 MG tablet Take 0.5 tablets (7.5 mg total) by mouth at bedtime. 05/25/17   Oneta RackLewis, Tanika N, NP  citalopram (CELEXA) 10 MG tablet Take 1 tablet (10 mg total) by mouth daily. 05/26/17   Oneta RackLewis, Tanika N, NP  HYDROcodone-acetaminophen (NORCO/VICODIN) 5-325 MG tablet Take 1 tablet by mouth every 6 (six) hours as needed. Patient not taking: Reported on 01/13/2018 12/30/17   Wieters, Fran LowesHallie C, PA-C  hydrOXYzine (ATARAX/VISTARIL) 25 MG tablet Take 1 tablet (25 mg total) by mouth every 6 (six) hours as needed for anxiety. 09/04/17   Robinson, [place] N, PA-C  mometasone-formoterol (DULERA) 200-5 MCG/ACT AERO Place 2 puffs into alternate nostrils 2 (two) times daily.    [provider]  nicotine (NICODERM CQ - DOSED IN MG/24 HOURS) 21 mg/24hr patch Place 1 patch (21 mg total) onto the skin daily. Patient not taking: Reported on 08/11/2017 05/26/17   Oneta RackLewis, Tanika N, NP  nitrofurantoin, macrocrystal-monohydrate, (MACROBID) 100 MG capsule Take 1 capsule (100 mg total) by mouth 2 (two) times daily. 01/13/18   Kirichenko, Tatyana, PA-C  ondansetron (ZOFRAN) 4 MG tablet Take 1 tablet (4 mg total) by mouth every 6 (six) hours. 01/13/18   Kirichenko, Lemont Fillersatyana, PA-C  oxyCODONE (OXY IR/ROXICODONE) 5 MG immediate release tablet  Take 5 mg by mouth every 6 (six) hours as needed for pain. 01/07/18   [provider]  PROAIR HFA 108 (90 Base) MCG/ACT inhaler Place 1-2 puffs into alternate nostrils every 6 (six) hours as needed for wheezing. 12/18/17   [provider]  promethazine-dextromethorphan (PROMETHAZINE-DM) 6.25-15 MG/5ML syrup Take 5 mLs by mouth 4 (four) times daily as needed for cough. Patient not taking: Reported on 01/13/2018 10/31/17   Mathews Robinsons B, PA-C  SUMAtriptan (IMITREX) 50 MG tablet Take 50 mg by mouth daily as needed for migraine. 12/21/17   [provider]  traZODone (DESYREL) 50 MG tablet Take 1 tablet (50 mg total) by mouth at bedtime as needed for sleep. 05/25/17   Oneta Rack, NP    Family History Family History  Problem Relation Age of Onset  . Kidney Stones Father     Social History Social History   Tobacco Use  . Smoking status: Current Every Day Smoker    Types: Cigars  . Smokeless tobacco: Never Used  Substance Use Topics  . Alcohol use: No  . Drug use: No     Allergies   Patient has no known allergies.   Review of Systems Review of Systems  All other systems reviewed and are negative.    Physical Exam Updated Vital Signs BP 115/61   Pulse 62   Temp 98.6 F (37 C)   Resp (!) 22   LMP 03/12/2018   SpO2 98%   Physical Exam  Constitutional: She is oriented to person, place, and time. She appears well-developed and well-nourished.  HENT:  Head: Normocephalic and atraumatic.  Eyes: Pupils are equal, round, and reactive to light. Conjunctivae and EOM are normal.  Neck: Normal range of motion. Neck supple.  Cardiovascular: Normal rate, regular rhythm and intact distal pulses. Exam reveals no gallop and no friction rub.  No murmur heard. Intact distal pulses with brisk capillary refill  Pulmonary/Chest: Effort normal and breath sounds normal. No respiratory distress. She has no wheezes. She has no rales. She exhibits no tenderness.  Abdominal: Soft. Bowel sounds are normal. She exhibits no distension and no mass. There is no tenderness. There is no rebound and no guarding.  Musculoskeletal: Normal range of motion. She exhibits no edema or tenderness.  5/5 strength in right upper extremity, range of motion is limited somewhat secondary to pain, positive Hawkins-Kennedy  Neurological: She is alert and oriented to person, place, and time.  Sensation intact in right upper extremity 5/5  Skin: Skin is warm and dry.  Psychiatric: She has a normal mood and affect. Her behavior is normal. Judgment and thought  content normal.  Nursing note and vitals reviewed.    ED Treatments / Results  Labs (all labs ordered are listed, but only abnormal results are displayed) Labs Reviewed  CBC - Abnormal; Notable for the following components:      Result Value   RBC 3.83 (*)    Hemoglobin 10.3 (*)    HCT 31.6 (*)    RDW 16.6 (*)    All other components within normal limits  COMPREHENSIVE METABOLIC PANEL - Abnormal; Notable for the following components:   Potassium 3.0 (*)    Glucose, Bld 101 (*)    Creatinine, Ser 1.19 (*)    Calcium 8.4 (*)    Albumin 3.3 (*)    AST 14 (*)    GFR calc non Af Amer 58 (*)    All other components within normal limits  I-STAT CHEM 8, ED - Abnormal; Notable for the following components:   Potassium 3.0 (*)    BUN 4 (*)    Creatinine, Ser 1.10 (*)    Calcium, Ion 1.13 (*)    Hemoglobin 11.2 (*)    HCT 33.0 (*)    All other components within normal limits  PROTIME-INR  APTT  DIFFERENTIAL  I-STAT TROPONIN, ED  CBG MONITORING, ED  I-STAT BETA HCG BLOOD, ED ([facility], [facility], [facility] ONLY)    EKG EKG Interpretation  Date/Time:  Thursday March 18 2018 21:45:08 EDT Ventricular Rate:  79 PR Interval:  152 QRS Duration: 92 QT Interval:  378 QTC Calculation: 433 R Axis:   65 Text Interpretation:  Normal sinus rhythm with sinus arrhythmia Normal ECG No significant change since last tracing Confirmed by Drema Pry [phone]) on 03/19/2018 12:11:43 AM   Radiology Ct Head Wo Contrast  Result Date: 03/18/2018 CLINICAL DATA:  RIGHT arm numbness and weakness for 2 months, worsening this afternoon. History of migraines. EXAM: CT HEAD WITHOUT CONTRAST TECHNIQUE: Contiguous axial images were obtained from the base of the skull through the vertex without intravenous contrast. COMPARISON:  None. FINDINGS: BRAIN: No intraparenchymal hemorrhage, mass effect nor midline shift. The ventricles and sulci are normal. Mild asymmetric fourth ventricle due to obliquity in the scanner. No acute  large vascular territory infarcts. No abnormal extra-axial fluid collections. Basal cisterns are patent. VASCULAR: Unremarkable. SKULL/SOFT TISSUES: No skull fracture. No significant soft tissue swelling. ORBITS/SINUSES: The included ocular globes and orbital contents are normal.The mastoid aircells and included paranasal sinuses are well-aerated. OTHER: None. IMPRESSION: Negative noncontrast CT HEAD. Electronically Signed   By: Awilda Metro M.D.   On: 03/18/2018 22:20    Procedures Procedures (including critical care time)  Medications Ordered in ED Medications  potassium chloride SA (K-DUR,KLOR-CON) CR tablet 40 mEq (40 mEq Oral Given 03/19/18 0122)     Initial Impression / Assessment and Plan / ED Course  I have reviewed the triage vital signs and the nursing notes.  Pertinent labs & imaging results that were available during my care of the patient were reviewed by me and considered in my medical decision making (see chart for details).     Patient with right upper arm pain x5 months.  She has reduced range of motion secondary to pain, but her strength is 5/5.  No bony abnormality or deformity, no evidence of skin changes or rash or infection.  She has good pulses and good sensation.  She is neurovascularly intact.  Denies any neck pain or back pain.  We will have her follow-up with orthopedics for possible shoulder impingement versus cervical radiculopathy.  Patient understands and agrees the plan.  Final Clinical Impressions(s) / ED Diagnoses   Final diagnoses:  Impingement syndrome of right shoulder  Cervical radiculopathy    ED Discharge Orders    None       Roxy Horseman, PA-C 03/19/18 0131    Nira Conn, MD 03/19/18 [phone]

## 2018-03-22 ENCOUNTER — Ambulatory Visit (HOSPITAL_COMMUNITY)
Admission: EM | Admit: 2018-03-22 | Discharge: 2018-03-22 | Disposition: A | Discharge status: Home / Self Care | Payer: Medicaid Other | Attending: Family Medicine | Admitting: Family Medicine

## 2018-03-22 ENCOUNTER — Encounter (HOSPITAL_COMMUNITY): Payer: Self-pay | Admitting: Emergency Medicine

## 2018-03-22 DIAGNOSIS — M79601 Pain in right arm: Secondary | ICD-10-CM

## 2018-03-22 MED ORDER — NAPROXEN 500 MG PO TABS: 500.0000 mg | ORAL_TABLET | Freq: Two times a day (BID) | ORAL | 0 refills | Status: DC

## 2018-03-22 MED ORDER — KETOROLAC TROMETHAMINE 60 MG/2ML IM SOLN
60.0000 mg | Freq: Once | INTRAMUSCULAR | Status: AC
  Administered 2018-03-22: 60 mg via INTRAMUSCULAR

## 2018-03-22 MED ORDER — KETOROLAC TROMETHAMINE 60 MG/2ML IM SOLN
INTRAMUSCULAR | Status: AC
  Filled 2018-03-22: qty 2

## 2018-03-22 NOTE — Discharge Instructions (Signed)
Please limit use of sling and try to regularly provide range of motion exercises to right shoulder and right hand.  Squeeze and flex hand.  May start naproxen twice a day tomorrow, take with food. Please follow up with orthopedics for further evaluation and treatment.

## 2018-03-22 NOTE — ED Triage Notes (Signed)
Right arm / shoulder pain for 5 months. No injury. Has worsened significantly.

## 2018-03-22 NOTE — ED Provider Notes (Signed)
[facility]    CSN: [id number] Arrival date & time: 03/22/18  1953     History   Chief Complaint Chief Complaint  Patient presents with  . Arm Pain    HPI Norma Moreno is a 37 y.o. female.   Freedom presents with complaints of right arm pain. This has been ongoing for 5-6 months. No known injury. States feels like there are pins to her hand and arm. Pain with any movement or touch. Has been taking excedrine which has helped some but for short amount of time. Pain currently 10/10. Took excedrine last at noon today. No neck pain. Does not work, no repetitive use. Has been using a sling. Has not followed with orthopedics. No previous similar. She is right handed. Left arm feels well. Seen in ER 7/25 without acute findings, labs, head ct, and ekg completed. Was not started on any medications. Hx of arthritis, asthma, back pain, psychosis, spinal stenosis, thrombocytopenia, asthma.    ROS per HPI.      Past Medical History:  Diagnosis Date  . Arthritis   . Asthma   . Back pain   . Depression   . Psychosis (HCC)   . Sickle cell trait (HCC)   . Spinal stenosis   . Tobacco abuse     Patient Active Problem List   Diagnosis Date Noted  . Severe episode of recurrent major depressive disorder, with psychotic features (HCC)   . MDD (major depressive disorder), recurrent, severe, with psychosis (HCC) 05/20/2017  . Acute pyelonephritis 03/08/2017  . AKI (acute kidney injury) (HCC) 03/08/2017  . Thrombocytopenia (HCC) 03/08/2017  . Diarrhea 03/08/2017  . Abdominal pain 03/08/2017  . Pyelonephritis 03/08/2017  . Asthma   . Tobacco abuse     Past Surgical History:  Procedure Laterality Date  . CESAREAN SECTION     x 4  . CHOLECYSTECTOMY      OB History   None      Home Medications    Prior to Admission medications   Medication Sig Start Date End Date Taking? Authorizing Provider  ARIPiprazole (ABILIFY) 15 MG tablet Take 0.5 tablets (7.5 mg total) by  mouth at bedtime. 05/25/17  Yes Oneta Rack, NP  citalopram (CELEXA) 10 MG tablet Take 1 tablet (10 mg total) by mouth daily. 05/26/17  Yes Oneta Rack, NP  hydrOXYzine (ATARAX/VISTARIL) 25 MG tablet Take 1 tablet (25 mg total) by mouth every 6 (six) hours as needed for anxiety. 09/04/17  Yes Roxan Hockey, [place] N, PA-C  mometasone-formoterol (DULERA) 200-5 MCG/ACT AERO Place 2 puffs into alternate nostrils 2 (two) times daily.   Yes [provider]  topiramate (TOPAMAX) 25 MG tablet Take 25 mg by mouth 2 (two) times daily.   Yes [provider]  albuterol (PROVENTIL) (2.5 MG/3ML) 0.083% nebulizer solution Take 3 mLs (2.5 mg total) by nebulization every 6 (six) hours as needed for wheezing or shortness of breath. Patient not taking: Reported on 01/13/2018 10/31/17   Mathews Robinsons B, PA-C  naproxen (NAPROSYN) 500 MG tablet Take 1 tablet (500 mg total) by mouth 2 (two) times daily. 03/22/18   Georgetta Haber, NP  PROAIR HFA 108 (90 Base) MCG/ACT inhaler Place 1-2 puffs into alternate nostrils every 6 (six) hours as needed for wheezing. 12/18/17   [provider]  SUMAtriptan (IMITREX) 50 MG tablet Take 50 mg by mouth daily as needed for migraine. 12/21/17   [provider]    Family History Family History  Problem  Relation Age of Onset  . Kidney Stones Father     Social History Social History   Tobacco Use  . Smoking status: Current Every Day Smoker    Types: Cigars  . Smokeless tobacco: Never Used  Substance Use Topics  . Alcohol use: No  . Drug use: No     Allergies   Patient has no known allergies.   Review of Systems Review of Systems   Physical Exam Triage Vital Signs ED Triage Vitals  Enc Vitals Group     BP 03/22/18 2002 (!) 125/53     Pulse Rate 03/22/18 2002 70     Resp 03/22/18 2002 16     Temp 03/22/18 2002 98 F (36.7 C)     Temp Source 03/22/18 2002 Oral     SpO2 03/22/18 2002 100 %     Weight 03/22/18 2003 178 lb (80.7  kg)     Height --      Head Circumference --      Peak Flow --      Pain Score 03/22/18 2003 10     Pain Loc --      Pain Edu? --      Excl. in GC? --    No data found.  Updated Vital Signs BP (!) 125/53   Pulse 70   Temp 98 F (36.7 C) (Oral)   Resp 16   Wt 178 lb (80.7 kg)   LMP 03/12/2018   SpO2 100%   BMI 30.55 kg/m    Physical Exam  Constitutional: She is oriented to person, place, and time. She appears well-developed and well-nourished. No distress.  Cardiovascular: Normal rate, regular rhythm and normal heart sounds.  Pulmonary/Chest: Effort normal. She has wheezes.  Musculoskeletal:  Patient in sling on initial presentation; very limited participation in exam as any touch to any part of hand arm or shoulder causes pain; limited active ROM to hand, elbow or shoulder; gross sensation intact and cap refill <2 seconds, strong radial pulse; pain primarily with raising of right arm in abduction and forward flexion; full passive ROM to right elbow and wrist; no neck pain on palpation or with ROM; no redness or swelling   Neurological: She is alert and oriented to person, place, and time.  Skin: Skin is warm and dry.     UC Treatments / Results  Labs (all labs ordered are listed, but only abnormal results are displayed) Labs Reviewed - No data to display  EKG None  Radiology No results found.  Procedures Procedures (including critical care time)  Medications Ordered in UC Medications  ketorolac (TORADOL) injection 60 mg (has no administration in time range)    Initial Impression / Assessment and Plan / UC Course  I have reviewed the triage vital signs and the nursing notes.  Pertinent labs & imaging results that were available during my care of the patient were reviewed by me and considered in my medical decision making (see chart for details).     Encouraged limited use of sling to promote ROm. Ongoing pain for 5-6 months. No injury. No neck pain. Entire  right arm and hand pain. Will start with naproxen for pain, question radiculopathy as well as mentioned in ER. Recommend follow up with ortho for further evaluation and treatment. Patient verbalized understanding and agreeable to plan.    Final Clinical Impressions(s) / UC Diagnoses   Final diagnoses:  Right arm pain     Discharge Instructions     Please  limit use of sling and try to regularly provide range of motion exercises to right shoulder and right hand.  Squeeze and flex hand.  May start naproxen twice a day tomorrow, take with food. Please follow up with orthopedics for further evaluation and treatment.    ED Prescriptions    Medication Sig Dispense Auth. Provider   naproxen (NAPROSYN) 500 MG tablet Take 1 tablet (500 mg total) by mouth 2 (two) times daily. 30 tablet Georgetta HaberBurky, Lyndall Bellot B, NP     Controlled Substance Prescriptions [place] Controlled Substance Registry consulted? Not Applicable   Georgetta HaberBurky, Thedore Pickel B, NP 03/22/18 2105

## 2018-08-08 ENCOUNTER — Encounter (HOSPITAL_COMMUNITY): Payer: Self-pay

## 2018-08-08 ENCOUNTER — Other Ambulatory Visit: Payer: Self-pay

## 2018-08-08 ENCOUNTER — Ambulatory Visit (HOSPITAL_COMMUNITY)
Admission: EM | Admit: 2018-08-08 | Discharge: 2018-08-08 | Disposition: A | Discharge status: Home / Self Care | Payer: Medicaid Other | Attending: Emergency Medicine | Admitting: Emergency Medicine

## 2018-08-08 DIAGNOSIS — R05 Cough: Secondary | ICD-10-CM | POA: Diagnosis not present

## 2018-08-08 DIAGNOSIS — J4521 Mild intermittent asthma with (acute) exacerbation: Secondary | ICD-10-CM | POA: Diagnosis not present

## 2018-08-08 DIAGNOSIS — B9789 Other viral agents as the cause of diseases classified elsewhere: Secondary | ICD-10-CM | POA: Insufficient documentation

## 2018-08-08 DIAGNOSIS — J069 Acute upper respiratory infection, unspecified: Secondary | ICD-10-CM | POA: Diagnosis not present

## 2018-08-08 MED ORDER — ALBUTEROL SULFATE HFA 108 (90 BASE) MCG/ACT IN AERS: 1.0000 | INHALATION_SPRAY | Freq: Four times a day (QID) | RESPIRATORY_TRACT | 0 refills | Status: DC | PRN

## 2018-08-08 MED ORDER — IPRATROPIUM-ALBUTEROL 0.5-2.5 (3) MG/3ML IN SOLN
3.0000 mL | Freq: Once | RESPIRATORY_TRACT | Status: AC
  Administered 2018-08-08: 3 mL via RESPIRATORY_TRACT

## 2018-08-08 MED ORDER — IPRATROPIUM-ALBUTEROL 0.5-2.5 (3) MG/3ML IN SOLN
RESPIRATORY_TRACT | Status: AC
  Filled 2018-08-08: qty 3

## 2018-08-08 MED ORDER — DEXAMETHASONE SODIUM PHOSPHATE 10 MG/ML IJ SOLN
INTRAMUSCULAR | Status: AC
  Filled 2018-08-08: qty 1

## 2018-08-08 MED ORDER — BENZONATATE 200 MG PO CAPS: 200.0000 mg | ORAL_CAPSULE | Freq: Three times a day (TID) | ORAL | 0 refills | Status: AC | PRN

## 2018-08-08 MED ORDER — PREDNISONE 50 MG PO TABS: 50.0000 mg | ORAL_TABLET | Freq: Every day | ORAL | 0 refills | Status: AC

## 2018-08-08 MED ORDER — DEXAMETHASONE SODIUM PHOSPHATE 10 MG/ML IJ SOLN
10.0000 mg | Freq: Once | INTRAMUSCULAR | Status: AC
  Administered 2018-08-08: 10 mg via INTRAMUSCULAR

## 2018-08-08 MED ORDER — CETIRIZINE HCL 10 MG PO CAPS: 10.0000 mg | ORAL_CAPSULE | Freq: Every day | ORAL | 0 refills | Status: DC

## 2018-08-08 NOTE — ED Triage Notes (Signed)
Pt presents today with SOB, chest congestion, productive cough and wheezing that has been going on x2 days. Has a hx of asthma and does have an inhaler that she has used with no relief.

## 2018-08-08 NOTE — Discharge Instructions (Addendum)
Please begin daily Zyrtec or Claritin, I sent in the generic version of Zyrtec for you Please begin prednisone daily for the next 5 days, please take with food on your stomach and in the morning if you are able I have refilled albuterol inhaler May use Tessalon as needed for cough or over-the-counter Delsym, Robitussin-DM  Please follow-up if breathing not improving, worsening, developing fever, chest discomfort

## 2018-08-08 NOTE — ED Provider Notes (Signed)
[facility]    CSN: [id number] Arrival date & time: 08/08/18  1235     History   Chief Complaint Chief Complaint  Patient presents with  . Cough    HPI Norma Moreno is a 37 y.o. female history of tobacco use, asthma presenting today for evaluation of shortness of breath and URI symptoms.  Patient states that for the past 2 to 3 days she has had nasal congestion and coughing.  She has felt significantly short of breath.  Gets winded very easily.  Has had some wheezing.  Using albuterol inhaler without relief.  Has had hot and cold chills, but denies any known fevers.  She has not tried anything over-the-counter for her symptoms.  HPI  Past Medical History:  Diagnosis Date  . Arthritis   . Asthma   . Back pain   . Depression   . Psychosis (HCC)   . Sickle cell trait (HCC)   . Spinal stenosis   . Tobacco abuse     Patient Active Problem List   Diagnosis Date Noted  . Severe episode of recurrent major depressive disorder, with psychotic features (HCC)   . MDD (major depressive disorder), recurrent, severe, with psychosis (HCC) 05/20/2017  . Acute pyelonephritis 03/08/2017  . AKI (acute kidney injury) (HCC) 03/08/2017  . Thrombocytopenia (HCC) 03/08/2017  . Diarrhea 03/08/2017  . Abdominal pain 03/08/2017  . Pyelonephritis 03/08/2017  . Asthma   . Tobacco abuse     Past Surgical History:  Procedure Laterality Date  . CESAREAN SECTION     x 4  . CHOLECYSTECTOMY      OB History   No obstetric history on file.      Home Medications    Prior to Admission medications   Medication Sig Start Date End Date Taking? Authorizing Provider  albuterol (PROVENTIL) (2.5 MG/3ML) 0.083% nebulizer solution Take 3 mLs (2.5 mg total) by nebulization every 6 (six) hours as needed for wheezing or shortness of breath. 10/31/17  Yes Mathews Robinsons B, PA-C  mometasone-formoterol (DULERA) 200-5 MCG/ACT AERO Place 2 puffs into alternate nostrils 2 (two) times daily.    Yes [provider]  albuterol (PROVENTIL HFA;VENTOLIN HFA) 108 (90 Base) MCG/ACT inhaler Inhale 1-2 puffs into the lungs every 6 (six) hours as needed for wheezing or shortness of breath. 08/08/18   Xayden Linsey C, PA-C  benzonatate (TESSALON) 200 MG capsule Take 1 capsule (200 mg total) by mouth 3 (three) times daily as needed for up to 7 days for cough. 08/08/18 08/15/18  Aisley Whan C, PA-C  Cetirizine HCl 10 MG CAPS Take 1 capsule (10 mg total) by mouth daily for 10 days. 08/08/18 08/18/18  Billyjack Trompeter C, PA-C  predniSONE (DELTASONE) 50 MG tablet Take 1 tablet (50 mg total) by mouth daily for 5 days. 08/08/18 08/13/18  Trelon Plush, Junius Creamer, PA-C    Family History Family History  Problem Relation Age of Onset  . Kidney Stones Father     Social History Social History   Tobacco Use  . Smoking status: Current Every Day Smoker    Types: Cigars  . Smokeless tobacco: Never Used  Substance Use Topics  . Alcohol use: No  . Drug use: No     Allergies   Patient has no known allergies.   Review of Systems Review of Systems  Constitutional: Positive for fatigue. Negative for activity change, appetite change, chills and fever.  HENT: Positive for congestion, rhinorrhea and sore throat. Negative for ear pain, sinus  pressure and trouble swallowing.   Eyes: Negative for discharge and redness.  Respiratory: Positive for cough, shortness of breath and wheezing. Negative for chest tightness.   Cardiovascular: Negative for chest pain.  Gastrointestinal: Negative for abdominal pain, diarrhea, nausea and vomiting.  Musculoskeletal: Negative for myalgias.  Skin: Negative for rash.  Neurological: Negative for dizziness, light-headedness and headaches.     Physical Exam Triage Vital Signs ED Triage Vitals  Enc Vitals Group     BP 08/08/18 1341 108/60     Pulse Rate 08/08/18 1341 88     Resp 08/08/18 1341 20     Temp 08/08/18 1341 98.6 F (37 C)     Temp Source  08/08/18 1341 Oral     SpO2 08/08/18 1341 98 %     Weight --      Height --      Head Circumference --      Peak Flow --      Pain Score 08/08/18 1344 0     Pain Loc --      Pain Edu? --      Excl. in GC? --    No data found.  Updated Vital Signs BP 108/60 (BP Location: Right Arm)   Pulse 88   Temp 98.6 F (37 C) (Oral)   Resp 20   SpO2 98%   Visual Acuity Right Eye Distance:   Left Eye Distance:   Bilateral Distance:    Right Eye Near:   Left Eye Near:    Bilateral Near:     Physical Exam Vitals signs and nursing note reviewed.  Constitutional:      General: She is not in acute distress.    Appearance: She is well-developed.  HENT:     Head: Normocephalic and atraumatic.     Ears:     Comments: Bilateral ears without tenderness to palpation of external auricle, tragus and mastoid, EAC's without erythema or swelling, TM's with good bony landmarks and cone of light. Non erythematous.    Nose:     Comments: Nasal mucosa erythematous, rhinorrhea present bilaterally    Mouth/Throat:     Comments: Oral mucosa pink and moist, no tonsillar enlargement or exudate. Posterior pharynx patent and erythematous, no uvula deviation or swelling. Normal phonation. Eyes:     Conjunctiva/sclera: Conjunctivae normal.  Neck:     Musculoskeletal: Neck supple.  Cardiovascular:     Rate and Rhythm: Normal rate and regular rhythm.     Heart sounds: No murmur.  Pulmonary:     Effort: Pulmonary effort is normal. No respiratory distress.     Breath sounds: Normal breath sounds.     Comments: Breathing comfortably at rest, inspiratory and expiratory wheezing and rhonchi throughout bilateral lung fields; patient become short of breath walking from chair to exam table Abdominal:     Palpations: Abdomen is soft.     Tenderness: There is no abdominal tenderness.  Skin:    General: Skin is warm and dry.  Neurological:     Mental Status: She is alert.      UC Treatments / Results   Labs (all labs ordered are listed, but only abnormal results are displayed) Labs Reviewed - No data to display  EKG None  Radiology No results found.  Procedures Procedures (including critical care time)  Medications Ordered in UC Medications  dexamethasone (DECADRON) injection 10 mg (has no administration in time range)  ipratropium-albuterol (DUONEB) 0.5-2.5 (3) MG/3ML nebulizer solution 3 mL (3 mLs Nebulization Given  08/08/18 1429)    Initial Impression / Assessment and Plan / UC Course  I have reviewed the triage vital signs and the nursing notes.  Pertinent labs & imaging results that were available during my care of the patient were reviewed by me and considered in my medical decision making (see chart for details).     Patient with asthma exacerbation likely secondary to viral URI.  Provided DuoNeb in clinic.  Will provide Decadron prior to discharge given shortness of breath.  Vital signs stable.  Will initiate prednisone beginning later this evening or tomorrow morning to continue steroids.  Tessalon as needed for cough.  Daily Zyrtec or allergy pill to help with congestion and drainage contributing to symptoms.Discussed strict return precautions. Patient verbalized understanding and is agreeable with plan.  Final Clinical Impressions(s) / UC Diagnoses   Final diagnoses:  Viral URI with cough  Mild intermittent asthma with exacerbation     Discharge Instructions     Please begin daily Zyrtec or Claritin, I sent in the generic version of Zyrtec for you Please begin prednisone daily for the next 5 days, please take with food on your stomach and in the morning if you are able I have refilled albuterol inhaler May use Tessalon as needed for cough or over-the-counter Delsym, Robitussin-DM  Please follow-up if breathing not improving, worsening, developing fever, chest discomfort    ED Prescriptions    Medication Sig Dispense Auth. Provider   Cetirizine HCl 10 MG  CAPS Take 1 capsule (10 mg total) by mouth daily for 10 days. 10 capsule Leiann Sporer C, PA-C   predniSONE (DELTASONE) 50 MG tablet Take 1 tablet (50 mg total) by mouth daily for 5 days. 5 tablet Brendy Ficek C, PA-C   albuterol (PROVENTIL HFA;VENTOLIN HFA) 108 (90 Base) MCG/ACT inhaler Inhale 1-2 puffs into the lungs every 6 (six) hours as needed for wheezing or shortness of breath. 1 Inhaler Ozell Juhasz C, PA-C   benzonatate (TESSALON) 200 MG capsule Take 1 capsule (200 mg total) by mouth 3 (three) times daily as needed for up to 7 days for cough. 28 capsule Krystelle Prashad C, PA-C     Controlled Substance Prescriptions [place] Controlled Substance Registry consulted? Not Applicable   Lew DawesWieters, Rand Etchison C, [place]PA-C 08/08/18 1445

## 2018-08-13 IMAGING — DX DG CHEST 2V
2 series · 2 of 2 positions shown · non-contrast
Comparison: 11/07/2016.

CLINICAL DATA: Chest pain.

EXAM:
CHEST  2 VIEW

[chest pa]
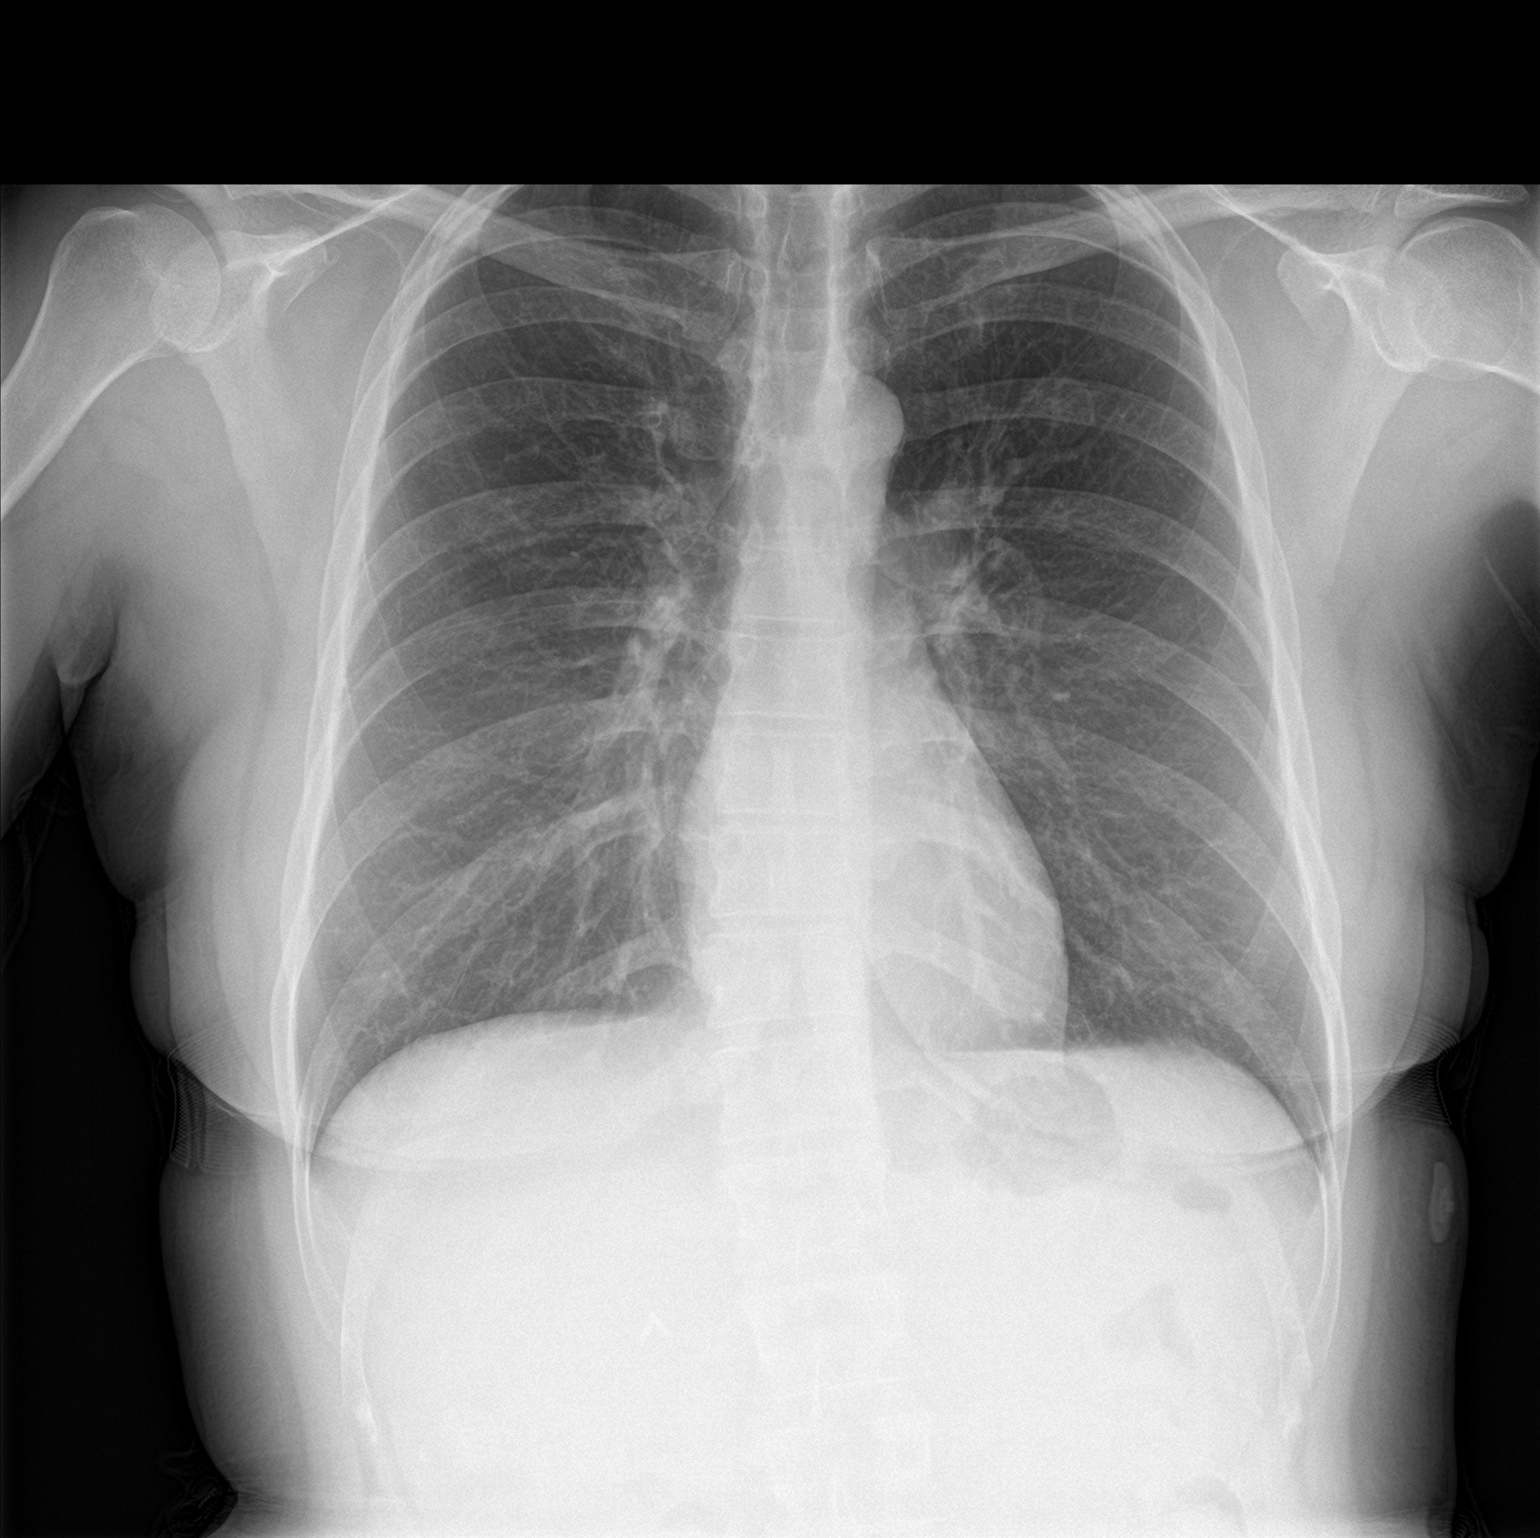

[chest lat]
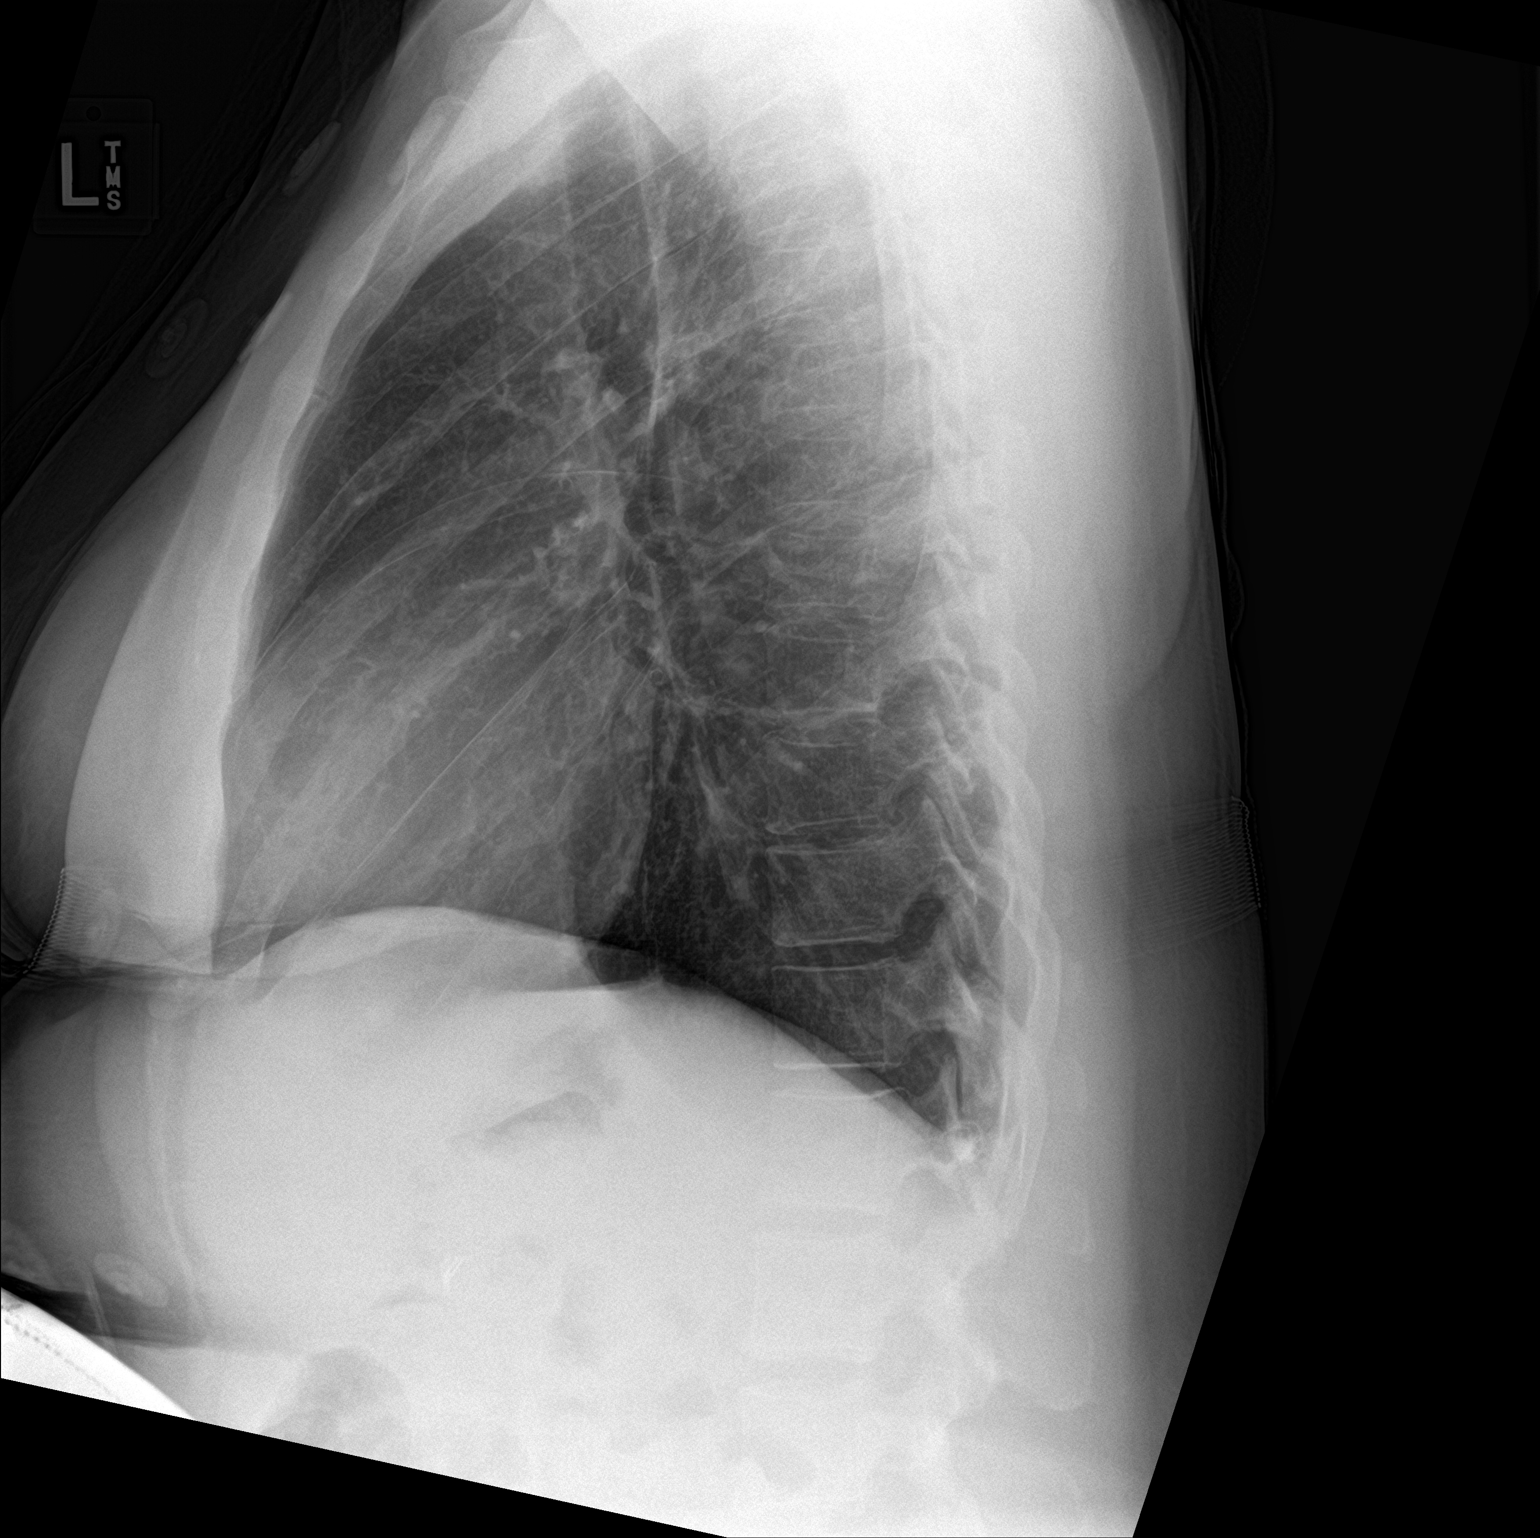

[2 of 2 positions shown; findings below may reference images not displayed]

FINDINGS: Mediastinum and hilar structures normal. Lungs are clear. No pleural
effusion or pneumothorax. Degenerative changes and scoliosis
thoracic spine.
IMPRESSION: No acute cardiopulmonary disease.  Chest is stable from prior exam.

## 2018-12-22 IMAGING — CR DG CHEST 2V
2 series · 2 of 2 positions shown · non-contrast
Comparison: 10/31/2017

CLINICAL DATA: Cough, fever

EXAM:
CHEST - 2 VIEW

[w chest lat]
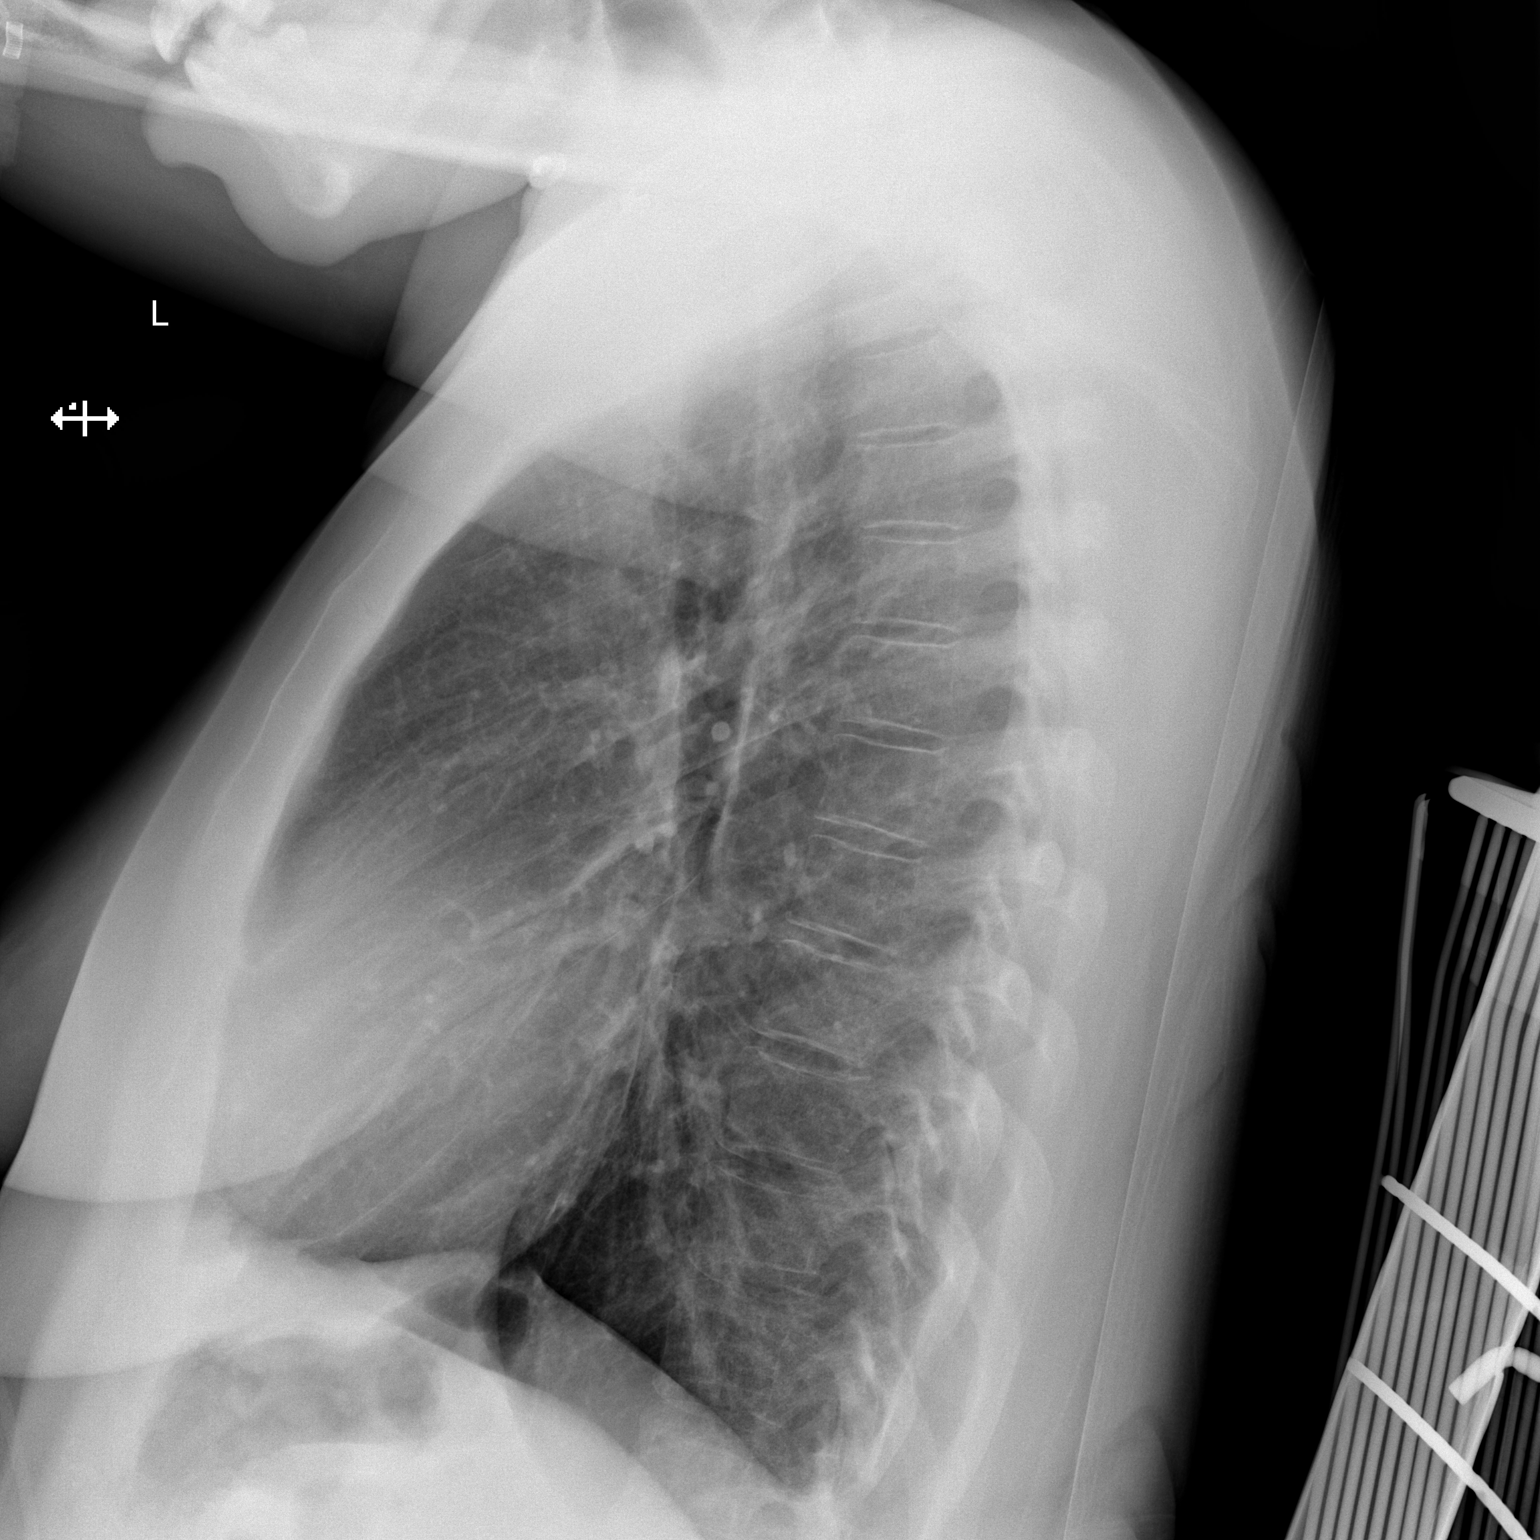

[x chest ap]
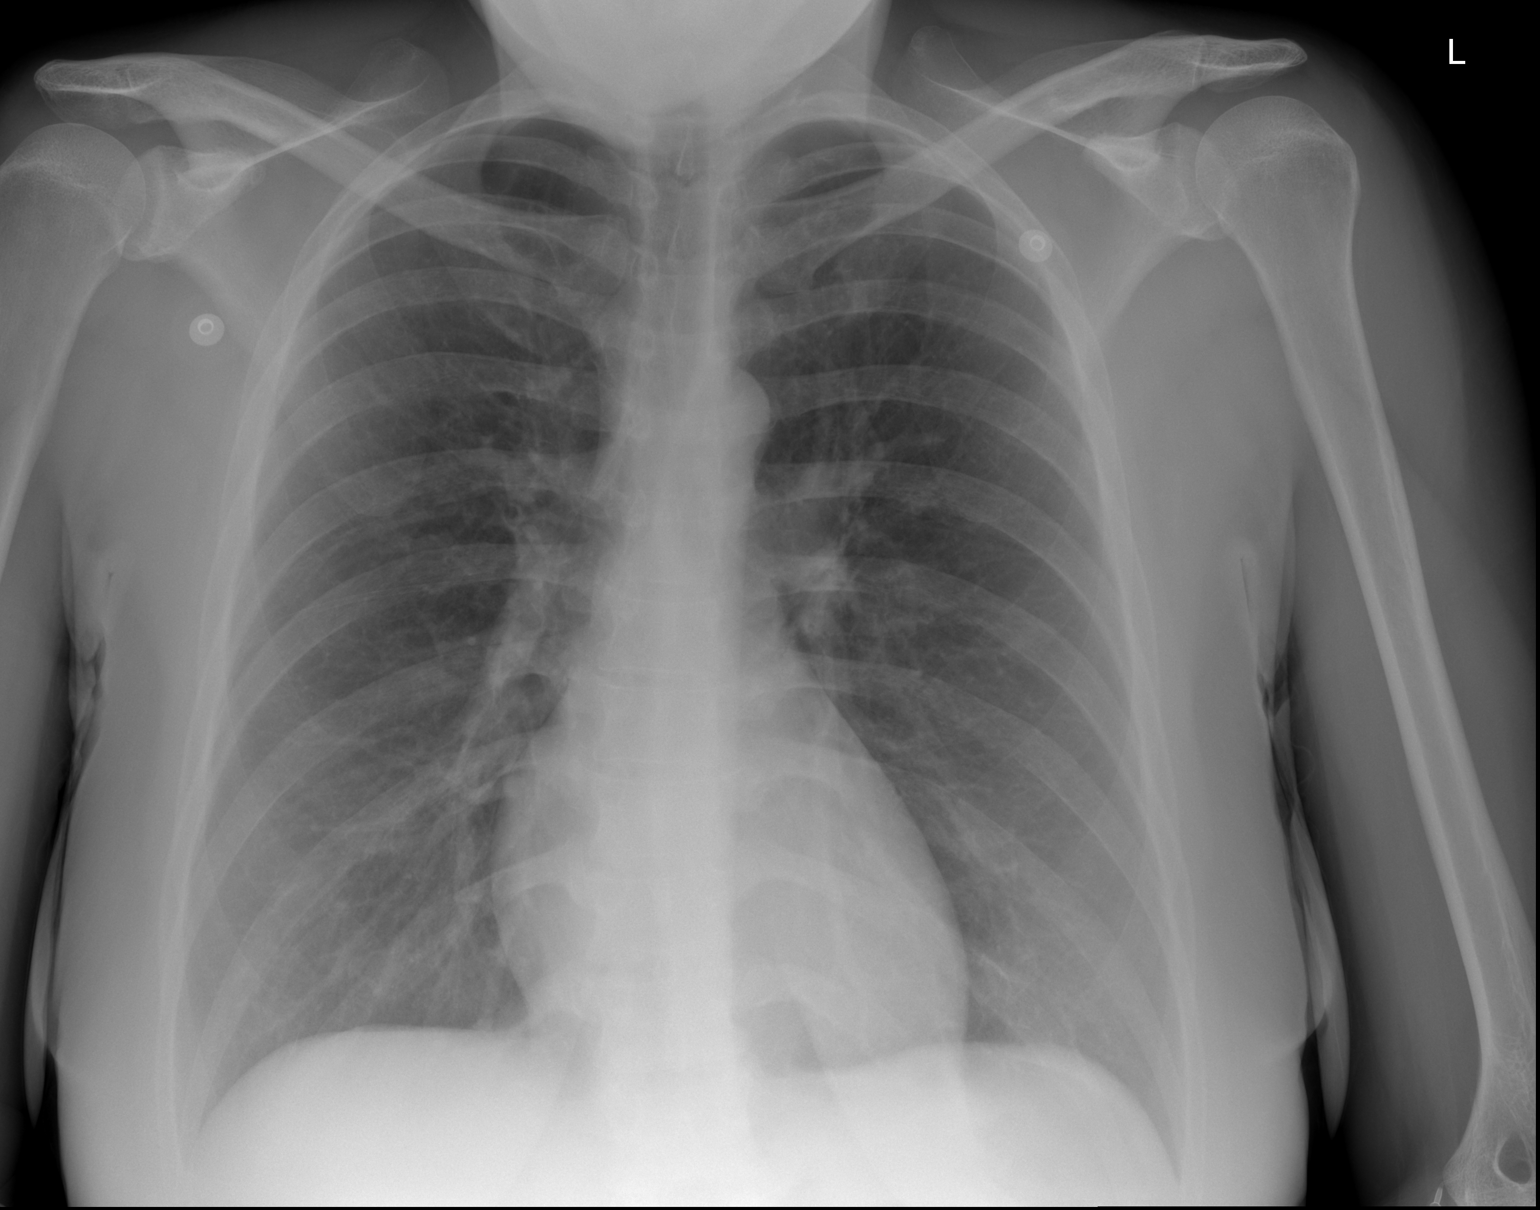

[2 of 2 positions shown; findings below may reference images not displayed]

FINDINGS: Heart and mediastinal contours are within normal limits. No focal
opacities or effusions. No acute bony abnormality.
IMPRESSION: No active cardiopulmonary disease.

## 2019-12-15 ENCOUNTER — Other Ambulatory Visit: Payer: Self-pay

## 2019-12-15 ENCOUNTER — Encounter (HOSPITAL_COMMUNITY): Payer: Self-pay

## 2019-12-15 ENCOUNTER — Ambulatory Visit (HOSPITAL_COMMUNITY)
Admission: EM | Admit: 2019-12-15 | Discharge: 2019-12-15 | Disposition: A | Discharge status: Home / Self Care | Payer: Medicaid Other | Attending: Family Medicine | Admitting: Family Medicine

## 2019-12-15 DIAGNOSIS — N939 Abnormal uterine and vaginal bleeding, unspecified: Secondary | ICD-10-CM | POA: Insufficient documentation

## 2019-12-15 LAB — POCT URINALYSIS DIP (DEVICE)
Bilirubin Urine: NEGATIVE
Glucose, UA: NEGATIVE mg/dL
Ketones, ur: NEGATIVE mg/dL
Leukocytes,Ua: NEGATIVE
Nitrite: NEGATIVE
Protein, ur: NEGATIVE mg/dL
Specific Gravity, Urine: 1.02 (ref 1.005–1.030)
Urobilinogen, UA: 0.2 mg/dL (ref 0.0–1.0)
pH: 7 (ref 5.0–8.0)

## 2019-12-15 MED ORDER — MEGESTROL ACETATE 40 MG PO TABS: 40.0000 mg | ORAL_TABLET | Freq: Two times a day (BID) | ORAL | 0 refills | Status: AC

## 2019-12-15 NOTE — ED Triage Notes (Signed)
Pt presents with abnormal vaginal bleeding X 2 weeks.

## 2019-12-15 NOTE — Discharge Instructions (Addendum)
Take the Megace twice a day for 1 week.  Follow-up with primary care or with the women's clinic to have an ultrasound of your uterus to see if we can identify the cause of your increased bleeding.  If you are experiencing heavier vaginal bleeding, experiencing lightheadedness, dizziness, passing out, I would have you go to the ER to be evaluated.  Your urine looked good in the office today, we will culture to be sure there is no infection.  These results will be available via MyChart.  If there is anything that grows and needs an antibiotic, we will be in contact with you for treatment.

## 2019-12-16 NOTE — ED Provider Notes (Signed)
[facility]    CSN: [id number] Arrival date & time: 12/15/19  1530      History   Chief Complaint Chief Complaint  Patient presents with  . Abnormal Vaginal Bleeding    HPI Norma Moreno is a 39 y.o. female.   Patient reports vaginal bleeding for the last 2 weeks.  Reports that the first week was almost like her normal cycle.  Reports that the bleeding this week is intermittently heavier and then really light.  She denies ever having this occur before.  Reports that when she is having heavy bleeding, she is going through 10-12 pads during the day, and on the lighter day only needing a panty liner.  Denies having an established gynecological relationship.  Reports that she has had her tubes tied, so she has not seen gynecology in years.  Per chart review, patient has medical history significant for pyelonephritis, asthma, tobacco use, depression.  ROS per HPI  The history is provided by the patient.    Past Medical History:  Diagnosis Date  . Arthritis   . Asthma   . Back pain   . Depression   . Psychosis (HCC)   . Sickle cell trait (HCC)   . Spinal stenosis   . Tobacco abuse     Patient Active Problem List   Diagnosis Date Noted  . Severe episode of recurrent major depressive disorder, with psychotic features (HCC)   . MDD (major depressive disorder), recurrent, severe, with psychosis (HCC) 05/20/2017  . Acute pyelonephritis 03/08/2017  . AKI (acute kidney injury) (HCC) 03/08/2017  . Thrombocytopenia (HCC) 03/08/2017  . Diarrhea 03/08/2017  . Abdominal pain 03/08/2017  . Pyelonephritis 03/08/2017  . Asthma   . Tobacco abuse     Past Surgical History:  Procedure Laterality Date  . CESAREAN SECTION     x 4  . CHOLECYSTECTOMY      OB History   No obstetric history on file.      Home Medications    Prior to Admission medications   Medication Sig Start Date End Date Taking? Authorizing Provider  albuterol (PROVENTIL HFA;VENTOLIN HFA) 108  (90 Base) MCG/ACT inhaler Inhale 1-2 puffs into the lungs every 6 (six) hours as needed for wheezing or shortness of breath. 08/08/18   Wieters, Hallie C, PA-C  albuterol (PROVENTIL) (2.5 MG/3ML) 0.083% nebulizer solution Take 3 mLs (2.5 mg total) by nebulization every 6 (six) hours as needed for wheezing or shortness of breath. 10/31/17   Georgiana Shore, PA-C  Cetirizine HCl 10 MG CAPS Take 1 capsule (10 mg total) by mouth daily for 10 days. 08/08/18 08/18/18  Wieters, Hallie C, PA-C  megestrol (MEGACE) 40 MG tablet Take 1 tablet (40 mg total) by mouth 2 (two) times daily for 7 days. 12/15/19 12/22/19  Moshe Cipro, NP  mometasone-formoterol (DULERA) 200-5 MCG/ACT AERO Place 2 puffs into alternate nostrils 2 (two) times daily.    [provider]    Family History Family History  Problem Relation Age of Onset  . Kidney Stones Father     Social History Social History   Tobacco Use  . Smoking status: Current Every Day Smoker    Types: Cigars  . Smokeless tobacco: Never Used  Substance Use Topics  . Alcohol use: No  . Drug use: No     Allergies   Patient has no known allergies.   Review of Systems Review of Systems   Physical Exam Triage Vital Signs ED Triage Vitals  Enc Vitals  Group     BP 12/15/19 1621 117/68     Pulse Rate 12/15/19 1621 62     Resp 12/15/19 1621 17     Temp 12/15/19 1621 98.4 F (36.9 C)     Temp Source 12/15/19 1621 Oral     SpO2 12/15/19 1621 100 %     Weight --      Height --      Head Circumference --      Peak Flow --      Pain Score 12/15/19 1620 0     Pain Loc --      Pain Edu? --      Excl. in [place]? --    No data found.  Updated Vital Signs BP 117/68 (BP Location: Right Arm)   Pulse 62   Temp 98.4 F (36.9 C) (Oral)   Resp 17   LMP 11/08/2019   SpO2 100%      Physical Exam Vitals and nursing note reviewed.  Constitutional:      General: She is not in acute distress.    Appearance: Normal appearance. She is  well-developed and normal weight. She is not ill-appearing.  HENT:     Head: Normocephalic and atraumatic.  Eyes:     Extraocular Movements: Extraocular movements intact.     Conjunctiva/sclera: Conjunctivae normal.     Pupils: Pupils are equal, round, and reactive to light.  Cardiovascular:     Rate and Rhythm: Normal rate and regular rhythm.     Heart sounds: Normal heart sounds. No murmur.  Pulmonary:     Effort: Pulmonary effort is normal. No respiratory distress.     Breath sounds: Normal breath sounds. No stridor. No wheezing, rhonchi or rales.  Chest:     Chest wall: No tenderness.  Abdominal:     General: Bowel sounds are normal. There is no distension.     Palpations: Abdomen is soft. There is no mass.     Tenderness: There is no abdominal tenderness. There is no right CVA tenderness, left CVA tenderness, guarding or rebound.     Hernia: No hernia is present.  Musculoskeletal:        General: Normal range of motion.     Cervical back: Normal range of motion and neck supple.  Skin:    General: Skin is warm and dry.     Capillary Refill: Capillary refill takes less than 2 seconds.  Neurological:     General: No focal deficit present.     Mental Status: She is alert and oriented to person, place, and time.  Psychiatric:        Mood and Affect: Mood normal.        Behavior: Behavior normal.        Thought Content: Thought content normal.      UC Treatments / Results  Labs (all labs ordered are listed, but only abnormal results are displayed) Labs Reviewed  POCT URINALYSIS DIP (DEVICE) - Abnormal; Notable for the following components:      Result Value   Hgb urine dipstick SMALL (*)    All other components within normal limits  URINE CULTURE    EKG   Radiology No results found.  Procedures Procedures (including critical care time)  Medications Ordered in UC Medications - No data to display  Initial Impression / Assessment and Plan / UC Course  I have  reviewed the triage vital signs and the nursing notes.  Pertinent labs & imaging results that were  available during my care of the patient were reviewed by me and considered in my medical decision making (see chart for details).     Abnormal vaginal bleeding: Presenting for abnormal vaginal bleeding for the last 2 weeks.  Reports that the first week was almost like her normal cycle.  This week she is experiencing heavy bleeding then intermittently light bleeding only needing a panty liner, with a heavier bleeding she is going through 10-12 pads a day.  UA in office positive for trace blood, no concern for infection today.  We will culture just to be sure.  Patient's lab values will be available via her MyChart account.  If patient requires treatment, we will contact you let her know.  Prescribed Megace 40 mg twice daily x7 days to help stop the extra bleeding.  Provided information for [facility].  Instructed patient that she needs to get established with gynecology so that they can do an ultrasound to help ascertain the cause of her bleeding.  Discussed with patient that though she is a little younger than usual, she could be premenopausal, there could be a fibroid, or ovarian cysts, that could be causing her bleeding.  Patient instructed that if she is having pain, feeling lightheaded, passing out, other concerning symptoms that she is to follow-up in the ER for further evaluation and treatment.  Patient verbalizes understanding and is in agreement with treatment plan. Final Clinical Impressions(s) / UC Diagnoses   Final diagnoses:  Abnormal vaginal bleeding     Discharge Instructions     Take the Megace twice a day for 1 week.  Follow-up with primary care or with the women's clinic to have an ultrasound of your uterus to see if we can identify the cause of your increased bleeding.  If you are experiencing heavier vaginal bleeding, experiencing lightheadedness, dizziness, passing  out, I would have you go to the ER to be evaluated.  Your urine looked good in the office today, we will culture to be sure there is no infection.  These results will be available via MyChart.  If there is anything that grows and needs an antibiotic, we will be in contact with you for treatment.    ED Prescriptions    Medication Sig Dispense Auth. Provider   megestrol (MEGACE) 40 MG tablet Take 1 tablet (40 mg total) by mouth 2 (two) times daily for 7 days. 14 tablet Moshe Cipro, NP     PDMP not reviewed this encounter.   Moshe Cipro, NP 12/16/19 1539

## 2019-12-17 LAB — URINE CULTURE: Culture: >=100000 — AB

## 2020-07-05 ENCOUNTER — Other Ambulatory Visit: Payer: Self-pay | Admitting: Nurse Practitioner

## 2020-07-05 DIAGNOSIS — R221 Localized swelling, mass and lump, neck: Secondary | ICD-10-CM

## 2020-07-10 ENCOUNTER — Ambulatory Visit: Payer: Medicaid Other | Admitting: Obstetrics and Gynecology

## 2020-07-13 ENCOUNTER — Other Ambulatory Visit: Payer: Self-pay | Admitting: Nurse Practitioner

## 2020-07-13 ENCOUNTER — Ambulatory Visit
Admission: RE | Admit: 2020-07-13 | Discharge: 2020-07-13 | Disposition: A | Discharge status: Home / Self Care | Payer: Medicaid Other | Source: Ambulatory Visit | Attending: Nurse Practitioner | Admitting: Nurse Practitioner

## 2020-07-13 DIAGNOSIS — R221 Localized swelling, mass and lump, neck: Secondary | ICD-10-CM

## 2020-11-15 ENCOUNTER — Ambulatory Visit: Payer: Medicaid Other | Admitting: Obstetrics and Gynecology

## 2022-01-15 ENCOUNTER — Telehealth (HOSPITAL_COMMUNITY): Payer: Self-pay

## 2022-01-15 NOTE — Telephone Encounter (Signed)
Voicemail    The patient called and left a voicemail and wanted to set up an appointment for herself and her 4 kids due to medication and refills.   The patient voiced been off medication for about a month now.    I called [phone] and left a detailed message to call the office back for clarification on the referral.

## 2022-02-23 ENCOUNTER — Encounter (HOSPITAL_COMMUNITY): Payer: Self-pay | Admitting: *Deleted

## 2022-02-23 ENCOUNTER — Emergency Department (HOSPITAL_COMMUNITY): Payer: Medicaid Other

## 2022-02-23 ENCOUNTER — Emergency Department (HOSPITAL_COMMUNITY)
Admission: EM | Admit: 2022-02-23 | Discharge: 2022-02-23 | Discharge status: Against Medical Advice | Payer: Medicaid Other | Attending: Student | Admitting: Student

## 2022-02-23 ENCOUNTER — Other Ambulatory Visit: Payer: Self-pay

## 2022-02-23 DIAGNOSIS — R0602 Shortness of breath: Secondary | ICD-10-CM | POA: Diagnosis present

## 2022-02-23 DIAGNOSIS — J45909 Unspecified asthma, uncomplicated: Secondary | ICD-10-CM | POA: Insufficient documentation

## 2022-02-23 DIAGNOSIS — E876 Hypokalemia: Secondary | ICD-10-CM

## 2022-02-23 DIAGNOSIS — Z79899 Other long term (current) drug therapy: Secondary | ICD-10-CM | POA: Diagnosis not present

## 2022-02-23 DIAGNOSIS — F17210 Nicotine dependence, cigarettes, uncomplicated: Secondary | ICD-10-CM | POA: Insufficient documentation

## 2022-02-23 DIAGNOSIS — J45901 Unspecified asthma with (acute) exacerbation: Secondary | ICD-10-CM | POA: Diagnosis not present

## 2022-02-23 DIAGNOSIS — J441 Chronic obstructive pulmonary disease with (acute) exacerbation: Secondary | ICD-10-CM

## 2022-02-23 LAB — I-STAT VENOUS BLOOD GAS, ED
Acid-Base Excess: 0 mmol/L (ref 0.0–2.0)
Bicarbonate: 21 mmol/L (ref 20.0–28.0)
Calcium, Ion: 1.07 mmol/L — ABNORMAL LOW (ref 1.15–1.40)
HCT: 33 % — ABNORMAL LOW (ref 36.0–46.0)
Hemoglobin: 11.2 g/dL — ABNORMAL LOW (ref 12.0–15.0)
O2 Saturation: 91 %
Potassium: 2.9 mmol/L — ABNORMAL LOW (ref 3.5–5.1)
Sodium: 138 mmol/L (ref 135–145)
TCO2: 22 mmol/L (ref 22–32)
pCO2, Ven: 24.5 mmHg — ABNORMAL LOW (ref 44–60)
pH, Ven: 7.541 — ABNORMAL HIGH (ref 7.25–7.43)
pO2, Ven: 52 mmHg — ABNORMAL HIGH (ref 32–45)

## 2022-02-23 LAB — CBC WITH DIFFERENTIAL/PLATELET
Abs Immature Granulocytes: 0.01 10*3/uL (ref 0.00–0.07)
Basophils Absolute: 0 10*3/uL (ref 0.0–0.1)
Basophils Relative: 0 %
Eosinophils Absolute: 0.3 10*3/uL (ref 0.0–0.5)
Eosinophils Relative: 4 %
HCT: 31.6 % — ABNORMAL LOW (ref 36.0–46.0)
Hemoglobin: 11 g/dL — ABNORMAL LOW (ref 12.0–15.0)
Immature Granulocytes: 0 %
Lymphocytes Relative: 43 %
Lymphs Abs: 3.3 10*3/uL (ref 0.7–4.0)
MCH: 28.3 pg (ref 26.0–34.0)
MCHC: 34.8 g/dL (ref 30.0–36.0)
MCV: 81.2 fL (ref 80.0–100.0)
Monocytes Absolute: 0.5 10*3/uL (ref 0.1–1.0)
Monocytes Relative: 7 %
Neutro Abs: 3.5 10*3/uL (ref 1.7–7.7)
Neutrophils Relative %: 46 %
Platelets: 181 10*3/uL (ref 150–400)
RBC: 3.89 MIL/uL (ref 3.87–5.11)
RDW: 14.5 % (ref 11.5–15.5)
WBC: 7.7 10*3/uL (ref 4.0–10.5)
nRBC: 0 % (ref 0.0–0.2)

## 2022-02-23 LAB — COMPREHENSIVE METABOLIC PANEL
ALT: 12 U/L (ref 0–44)
AST: 21 U/L (ref 15–41)
Albumin: 3.3 g/dL — ABNORMAL LOW (ref 3.5–5.0)
Alkaline Phosphatase: 52 U/L (ref 38–126)
Anion gap: 11 (ref 5–15)
BUN: <5 mg/dL — ABNORMAL LOW (ref 6–20)
CO2: 20 mmol/L — ABNORMAL LOW (ref 22–32)
Calcium: 8.5 mg/dL — ABNORMAL LOW (ref 8.9–10.3)
Chloride: 105 mmol/L (ref 98–111)
Creatinine, Ser: 0.98 mg/dL (ref 0.44–1.00)
GFR, Estimated: >60 mL/min (ref >60)
Glucose, Bld: 77 mg/dL (ref 70–99)
Potassium: 2.9 mmol/L — ABNORMAL LOW (ref 3.5–5.1)
Sodium: 136 mmol/L (ref 135–145)
Total Bilirubin: 0.4 mg/dL (ref 0.3–1.2)
Total Protein: 6.9 g/dL (ref 6.5–8.1)

## 2022-02-23 LAB — I-STAT BETA HCG BLOOD, ED (MC, WL, AP ONLY): I-stat hCG, quantitative: <5 m[IU]/mL (ref <5)

## 2022-02-23 LAB — TROPONIN I (HIGH SENSITIVITY)
Troponin I (High Sensitivity): 3 ng/L (ref <18)
Troponin I (High Sensitivity): <2 ng/L (ref <18)

## 2022-02-23 LAB — MAGNESIUM: Magnesium: 2.4 mg/dL (ref 1.7–2.4)

## 2022-02-23 LAB — POTASSIUM: Potassium: 3.9 mmol/L (ref 3.5–5.1)

## 2022-02-23 LAB — BRAIN NATRIURETIC PEPTIDE: B Natriuretic Peptide: 11.9 pg/mL (ref 0.0–100.0)

## 2022-02-23 MED ORDER — ACETAMINOPHEN 325 MG PO TABS: 650.0000 mg | ORAL_TABLET | Freq: Four times a day (QID) | ORAL | Status: DC | PRN

## 2022-02-23 MED ORDER — METHYLPREDNISOLONE SODIUM SUCC 125 MG IJ SOLR
125.0000 mg | Freq: Two times a day (BID) | INTRAMUSCULAR | Status: DC
  Administered 2022-02-23: 125 mg via INTRAVENOUS
  Filled 2022-02-23: qty 2

## 2022-02-23 MED ORDER — SODIUM CHLORIDE 0.9% FLUSH
3.0000 mL | Freq: Two times a day (BID) | INTRAVENOUS | Status: DC
Start: 2022-02-23 — End: 2022-02-23
  Administered 2022-02-23: 3 mL via INTRAVENOUS

## 2022-02-23 MED ORDER — ALBUTEROL SULFATE (2.5 MG/3ML) 0.083% IN NEBU
10.0000 mg | INHALATION_SOLUTION | Freq: Once | RESPIRATORY_TRACT | Status: AC
  Administered 2022-02-23: 10 mg via RESPIRATORY_TRACT
  Filled 2022-02-23: qty 12

## 2022-02-23 MED ORDER — ONDANSETRON HCL 4 MG/2ML IJ SOLN: 4.0000 mg | Freq: Four times a day (QID) | INTRAMUSCULAR | Status: DC | PRN

## 2022-02-23 MED ORDER — IPRATROPIUM-ALBUTEROL 0.5-2.5 (3) MG/3ML IN SOLN
9.0000 mL | Freq: Once | RESPIRATORY_TRACT | Status: AC
  Administered 2022-02-23: 9 mL via RESPIRATORY_TRACT
  Filled 2022-02-23: qty 9

## 2022-02-23 MED ORDER — ENOXAPARIN SODIUM 40 MG/0.4ML IJ SOSY
40.0000 mg | PREFILLED_SYRINGE | INTRAMUSCULAR | Status: DC
  Administered 2022-02-23: 40 mg via SUBCUTANEOUS
  Filled 2022-02-23: qty 0.4

## 2022-02-23 MED ORDER — ALBUTEROL SULFATE (2.5 MG/3ML) 0.083% IN NEBU
2.5000 mg | INHALATION_SOLUTION | RESPIRATORY_TRACT | Status: DC | PRN
  Administered 2022-02-23: 2.5 mg via RESPIRATORY_TRACT
  Filled 2022-02-23: qty 3

## 2022-02-23 MED ORDER — METHYLPREDNISOLONE SODIUM SUCC 125 MG IJ SOLR
125.0000 mg | Freq: Once | INTRAMUSCULAR | Status: AC
  Administered 2022-02-23: 125 mg via INTRAVENOUS
  Filled 2022-02-23: qty 2

## 2022-02-23 MED ORDER — MAGNESIUM SULFATE 2 GM/50ML IV SOLN
2.0000 g | Freq: Once | INTRAVENOUS | Status: AC
  Administered 2022-02-23: 2 g via INTRAVENOUS
  Filled 2022-02-23: qty 50

## 2022-02-23 MED ORDER — ACETAMINOPHEN 650 MG RE SUPP: 650.0000 mg | Freq: Four times a day (QID) | RECTAL | Status: DC | PRN

## 2022-02-23 MED ORDER — ONDANSETRON HCL 4 MG PO TABS: 4.0000 mg | ORAL_TABLET | Freq: Four times a day (QID) | ORAL | Status: DC | PRN

## 2022-02-23 MED ORDER — SODIUM CHLORIDE 0.9 % IV SOLN: INTRAVENOUS | Status: AC

## 2022-02-23 MED ORDER — POTASSIUM CHLORIDE 10 MEQ/100ML IV SOLN
10.0000 meq | INTRAVENOUS | Status: AC
  Administered 2022-02-23 (×3): 10 meq via INTRAVENOUS
  Filled 2022-02-23 (×3): qty 100

## 2022-02-23 NOTE — Progress Notes (Signed)
Brief same-day note  Patient is a 41 year old female with history of asthma who presented with 4 days history of shortness of breath, wheezing, cough.  On presentation she was afebrile, tachycardia with stable blood pressure.  Chest radiograph showed no acute findings.  Lab work showed potassium level 2.9.  Patient was put on BiPAP on admission.  Started on steroids. Patient seen and examined at the bedside this morning in the emergency department.  During my evaluation she was still on BiPAP.  Auscultation revealed mild bibasilar expiratory wheezing.  She was alert and oriented. I asked RN to liberate her from BiPAP.  Diet started. We will continue current management for now.

## 2022-02-23 NOTE — H&P (Signed)
History and Physical    Norma Moreno LPF:[id number] DOB: August 29, 1980 DOA: 02/23/2022  PCP: Maryagnes Amos, FNP   Patient coming from: Home   Chief Complaint: SOB   HPI: Norma Moreno is a pleasant 41 y.o. female with medical history significant for asthma, now presenting to the emergency department with 4 days of worsening shortness of breath and wheezing.  Patient reports that her symptoms began 4 days ago and have progressively worsened despite albuterol at home.  She became dyspneic at rest last night, prompting her presentation to the ED.  She has had a mild cough but denies any fever, chills, rhinorrhea, or sore throat.  She has some chest tightness that she attributes to difficulty breathing, but denies chest pain.  She reports being intubated for an asthma flare when she was 18.  ED Course: Upon arrival to the ED, patient is found to be afebrile with unlabored breathing, mild tachycardia, and stable blood pressure.  She was only able to speak 1 or 2 words at a time.  Chest x-ray was negative for acute cardiopulmonary disease.  Blood work notable for potassium 2.9, normal WBC, and normal troponin.  She was treated with multiple DuoNebs, albuterol, IV Solu-Medrol, and BiPAP.  Review of Systems:  All other systems reviewed and apart from HPI, are negative.  Past Medical History:  Diagnosis Date   Arthritis    Asthma    Back pain    Depression    Psychosis (HCC)    Sickle cell trait (HCC)    Spinal stenosis    Tobacco abuse     Past Surgical History:  Procedure Laterality Date   CESAREAN SECTION     x 4   CHOLECYSTECTOMY      Social History:   reports that she has been smoking cigars. She has never used smokeless tobacco. She reports that she does not drink alcohol and does not use drugs.  No Known Allergies  Family History  Problem Relation Age of Onset   Kidney Stones Father      Prior to Admission medications   Medication Sig Start Date End Date Taking?  Authorizing Provider  albuterol (PROVENTIL HFA;VENTOLIN HFA) 108 (90 Base) MCG/ACT inhaler Inhale 1-2 puffs into the lungs every 6 (six) hours as needed for wheezing or shortness of breath. 08/08/18   Wieters, Hallie C, PA-C  albuterol (PROVENTIL) (2.5 MG/3ML) 0.083% nebulizer solution Take 3 mLs (2.5 mg total) by nebulization every 6 (six) hours as needed for wheezing or shortness of breath. 10/31/17   Georgiana Shore, PA-C  Cetirizine HCl 10 MG CAPS Take 1 capsule (10 mg total) by mouth daily for 10 days. 08/08/18 08/18/18  Wieters, Hallie C, PA-C  mometasone-formoterol (DULERA) 200-5 MCG/ACT AERO Place 2 puffs into alternate nostrils 2 (two) times daily.    [provider]    Physical Exam: Vitals:   02/23/22 0303 02/23/22 0309 02/23/22 0315 02/23/22 0400  BP: 128/69  101/68 120/63  Pulse: (!) 102 89 (!) 103 100  Resp: (!) 29 17 (!) 26 (!) 30  Temp:      TempSrc:      SpO2: 100% 100% 100% 100%  Weight:      Height:        Constitutional: On BiPAP, no pallor, no diaphoresis Eyes: PERTLA, lids and conjunctivae normal ENMT: Mucous membranes are moist. Posterior pharynx clear of any exudate or lesions.   Neck: supple, no masses  Respiratory: Prolonged expiratory phase with wheezing. Using accessory muscles.  Cardiovascular: S1 & S2 heard, regular rate and rhythm. No extremity edema.  Abdomen: No distension, no tenderness, soft. Bowel sounds active.  Musculoskeletal: no clubbing / cyanosis. No joint deformity upper and lower extremities.   Skin: no significant rashes, lesions, ulcers. Warm, dry, well-perfused. Neurologic: CN 2-12 grossly intact. Moving all extremities. Alert and oriented.  Psychiatric: Pleasant. Cooperative.    Labs and Imaging on Admission: I have personally reviewed following labs and imaging studies  CBC: Recent Labs  Lab 02/23/22 0157 02/23/22 0212  WBC 7.7  --   NEUTROABS 3.5  --   HGB 11.0* 11.2*  HCT 31.6* 33.0*  MCV 81.2  --   PLT 181  --     Basic Metabolic Panel: Recent Labs  Lab 02/23/22 0157 02/23/22 0212  NA 136 138  K 2.9* 2.9*  CL 105  --   CO2 20*  --   GLUCOSE 77  --   BUN <5*  --   CREATININE 0.98  --   CALCIUM 8.5*  --    GFR: Estimated Creatinine Clearance: 79.2 mL/min (by C-G formula based on SCr of 0.98 mg/dL). Liver Function Tests: Recent Labs  Lab 02/23/22 0157  AST 21  ALT 12  ALKPHOS 52  BILITOT 0.4  PROT 6.9  ALBUMIN 3.3*   No results for input(s): "LIPASE", "AMYLASE" in the last 168 hours. No results for input(s): "AMMONIA" in the last 168 hours. Coagulation Profile: No results for input(s): "INR", "PROTIME" in the last 168 hours. Cardiac Enzymes: No results for input(s): "CKTOTAL", "CKMB", "CKMBINDEX", "TROPONINI" in the last 168 hours. BNP (last 3 results) No results for input(s): "PROBNP" in the last 8760 hours. HbA1C: No results for input(s): "HGBA1C" in the last 72 hours. CBG: No results for input(s): "GLUCAP" in the last 168 hours. Lipid Profile: No results for input(s): "CHOL", "HDL", "LDLCALC", "TRIG", "CHOLHDL", "LDLDIRECT" in the last 72 hours. Thyroid Function Tests: No results for input(s): "TSH", "T4TOTAL", "FREET4", "T3FREE", "THYROIDAB" in the last 72 hours. Anemia Panel: No results for input(s): "VITAMINB12", "FOLATE", "FERRITIN", "TIBC", "IRON", "RETICCTPCT" in the last 72 hours. Urine analysis:    Component Value Date/Time   COLORURINE YELLOW 01/13/2018 1337   APPEARANCEUR CLEAR 01/13/2018 1337   LABSPEC 1.020 12/15/2019 1705   PHURINE 7.0 12/15/2019 1705   GLUCOSEU NEGATIVE 12/15/2019 1705   HGBUR SMALL (A) 12/15/2019 1705   BILIRUBINUR NEGATIVE 12/15/2019 1705   KETONESUR NEGATIVE 12/15/2019 1705   PROTEINUR NEGATIVE 12/15/2019 1705   UROBILINOGEN 0.2 12/15/2019 1705   NITRITE NEGATIVE 12/15/2019 1705   LEUKOCYTESUR NEGATIVE 12/15/2019 1705   Sepsis Labs: @LABRCNTIP (procalcitonin:4,lacticidven:4) )No results found for this or any previous visit  (from the past 240 hour(s)).   Radiological Exams on Admission: DG Chest Portable 1 View  Result Date: 02/23/2022 CLINICAL DATA:  Productive cough and difficulty breathing. EXAM: PORTABLE CHEST 1 VIEW COMPARISON:  Jan 13, 2018 FINDINGS: The heart size and mediastinal contours are within normal limits. Both lungs are clear. The visualized skeletal structures are unremarkable. IMPRESSION: No active disease. Electronically Signed   By: Jan 15, 2018 M.D.   On: 02/23/2022 02:33    EKG: Independently reviewed. Sinus rhythm.   Assessment/Plan   1. Acute asthma exacerbation  - Presents with 4 days of progressive SOB despite using albuterol at home  - No fever or leukocytosis, CXR clear  - Given multiple duoneb treatments, IV Solu-Medrol, and albuterol in ED and was started on BiPAP  - Give 2 g IV magnesium and continue albuterol, systemic  steroids, and BiPAP    2. Hypokalemia  - Replacing     DVT prophylaxis: Lovenox  Code Status: Full  Level of Care: Level of care: Progressive Family Communication: none present  Disposition Plan:  Patient is from: home  Anticipated d/c is to: home  Anticipated d/c date is: 7/4 or 02/26/22 Patient currently: Pending improvement in respiratory status  Consults called: none  Admission status: Inpatient     Vianne Bulls, MD [facility]  02/23/2022, 5:10 AM

## 2022-02-23 NOTE — ED Triage Notes (Signed)
Difficulty breathing for 4 days no fever n some productive cough  inhakers are not working   lmp 3 months ago

## 2022-02-23 NOTE — ED Notes (Signed)
Pt woke up and removed BIPAP and states she is ready to leave. Pt is alert and oriented with family at the bedside. MD notified.

## 2022-02-23 NOTE — ED Notes (Addendum)
Pt removed from bipap. Pt now complaining of chest pain to central chest and difficulty breathing since bipap removed. MD Adhikari paged.

## 2022-02-23 NOTE — ED Provider Notes (Signed)
[facility] EMERGENCY DEPARTMENT Provider Note  CSN: [id number] Arrival date & time: 02/23/22 0114  Chief Complaint(s) Shortness of Breath  HPI Norma Moreno is a 41 y.o. female with PMH psychosis, sickle cell trait, asthma who presents emergency department for evaluation of shortness of breath.  Patient states that shortness of breath began rapidly today and she is breaking through her home inhalers.  Patient arrives in significant respiratory distress and is speaking in one-word sentences with audible wheezing.  Additional history unable to be obtained secondary to severe respiratory distress.   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 Authorizing Provider  albuterol (PROVENTIL HFA;VENTOLIN HFA) 108 (90 Base) MCG/ACT inhaler Inhale 1-2 puffs into the lungs every 6 (six) hours as needed for wheezing or shortness of breath. 08/08/18   Wieters, Hallie C, PA-C  albuterol (PROVENTIL) (2.5 MG/3ML) 0.083% nebulizer solution Take 3 mLs (2.5 mg total) by nebulization every 6 (six) hours as needed for wheezing or shortness of breath. 10/31/17   Georgiana Shore, PA-C  Cetirizine HCl 10 MG CAPS Take 1 capsule (10 mg total) by mouth daily for 10 days. 08/08/18 08/18/18  Wieters,  Hallie C, PA-C  mometasone-formoterol (DULERA) 200-5 MCG/ACT AERO Place 2 puffs into alternate nostrils 2 (two) times daily.    [provider]                                                                                                                                    Past Surgical History Past Surgical History:  Procedure Laterality Date   CESAREAN SECTION     x 4   CHOLECYSTECTOMY     Family History Family History  Problem Relation Age of Onset   Kidney Stones Father     Social History Social History   Tobacco Use   Smoking status: Every Day    Types: Cigars   Smokeless tobacco: Never  Substance Use Topics   Alcohol use: No   Drug use: No   Allergies Patient has no known allergies.  Review of Systems Review of Systems  Unable to perform ROS: Severe respiratory distress  Physical Exam Vital Signs  I have reviewed the triage vital signs BP 128/78   Pulse 90   Temp 98.5 F (36.9 C) (Oral)   Resp (!) 24   Ht 5\' 4"  (1.626 m)   Wt 80.7 kg   SpO2 100%   BMI 30.54 kg/m   Physical Exam Vitals and nursing note reviewed.  Constitutional:      General: She is in acute distress.     Appearance: She is well-developed. She is ill-appearing.  HENT:     Head: Normocephalic and atraumatic.  Eyes:     Conjunctiva/sclera: Conjunctivae normal.  Cardiovascular:     Rate and Rhythm: Normal rate and regular rhythm.     Heart sounds: No murmur heard. Pulmonary:     Effort: Tachypnea, accessory muscle usage and respiratory distress present.     Breath sounds: Wheezing present.  Abdominal:     Palpations: Abdomen is soft.     Tenderness: There is no abdominal tenderness.  Musculoskeletal:        General: No swelling.     Cervical back: Neck supple.  Skin:    General: Skin is warm and dry.     Capillary Refill: Capillary refill takes less than 2 seconds.  Neurological:     Mental Status: She is alert.  Psychiatric:        Mood and Affect: Mood  normal.     ED Results and Treatments Labs (all labs ordered are listed, but only abnormal results are displayed) Labs Reviewed  CBC WITH DIFFERENTIAL/PLATELET - Abnormal; Notable for the following components:      Result Value   Hemoglobin 11.0 (*)    HCT 31.6 (*)    All other components within normal limits  I-STAT VENOUS BLOOD GAS, ED - Abnormal; Notable for the following components:   pH, Ven 7.541 (*)    pCO2, Ven 24.5 (*)    pO2, Ven 52 (*)    Potassium 2.9 (*)    Calcium, Ion 1.07 (*)    HCT 33.0 (*)    Hemoglobin 11.2 (*)    All other components within normal limits  COMPREHENSIVE METABOLIC PANEL  BRAIN NATRIURETIC PEPTIDE  BLOOD GAS, VENOUS  I-STAT BETA HCG BLOOD, ED ([facility], [facility], [facility] ONLY)  TROPONIN I (HIGH SENSITIVITY)                                                                                                                          Radiology No results found.  Pertinent labs & imaging results that were available during my care of the patient were reviewed by me and considered in my medical decision making (see MDM for details).  Medications Ordered in ED Medications  methylPREDNISolone sodium succinate (SOLU-MEDROL) 125 mg/2 mL injection 125 mg (has no administration in time range)  ipratropium-albuterol (DUONEB) 0.5-2.5 (3) MG/3ML nebulizer solution 9 mL (9 mLs Nebulization Given 02/23/22 0215)  Procedures .Critical Care  Performed by: Glendora Score, MD Authorized by: Glendora Score, MD   Critical care provider statement:    Critical care time (minutes):  30   Critical care was necessary to treat or prevent imminent or life-threatening deterioration of the following conditions:  Respiratory failure   Critical care was time spent personally by me on the following activities:  Development of treatment plan with patient or  surrogate, discussions with consultants, evaluation of patient's response to treatment, examination of patient, ordering and review of laboratory studies, ordering and review of radiographic studies, ordering and performing treatments and interventions, pulse oximetry, re-evaluation of patient's condition and review of old charts   (including critical care time)  Medical Decision Making / ED Course   This patient presents to the ED for concern of shortness of breath, this involves an extensive number of treatment options, and is a complaint that carries with it a high risk of complications and morbidity.  The differential diagnosis includes asthma exacerbation, COPD exacerbation, ACS, PE  MDM: Patient seen emergency department for evaluation of shortness of breath.  Physical exam reveals an ill-appearing patient with accessory muscle use, wheezing and tachypnea.  Laboratory evaluation with hypokalemia to 2.9, hemoglobin 11.0, pH 7.54, BNP negative, troponin negative.  Chest x-ray unremarkable.  Patient given 3 back-to-back DuoNebs and methylprednisolone and on reevaluation, wheezing improved but patient remains tachypneic with increased work of breathing and thus patient placed on BiPAP and a continuous albuterol treatment was administered.  Patient then admitted to the hospital service for asthma exacerbation on BiPAP.   Additional history obtained:  -External records from outside source obtained and reviewed including: Chart review including previous notes, labs, imaging, consultation notes   Lab Tests: -I ordered, reviewed, and interpreted labs.   The pertinent results include:   Labs Reviewed  CBC WITH DIFFERENTIAL/PLATELET - Abnormal; Notable for the following components:      Result Value   Hemoglobin 11.0 (*)    HCT 31.6 (*)    All other components within normal limits  I-STAT VENOUS BLOOD GAS, ED - Abnormal; Notable for the following components:   pH, Ven 7.541 (*)    pCO2, Ven  24.5 (*)    pO2, Ven 52 (*)    Potassium 2.9 (*)    Calcium, Ion 1.07 (*)    HCT 33.0 (*)    Hemoglobin 11.2 (*)    All other components within normal limits  COMPREHENSIVE METABOLIC PANEL  BRAIN NATRIURETIC PEPTIDE  BLOOD GAS, VENOUS  I-STAT BETA HCG BLOOD, ED ([facility], [facility], [facility] ONLY)  TROPONIN I (HIGH SENSITIVITY)      EKG   EKG Interpretation  Date/Time:  Sunday February 23 2022 01:33:46 EDT Ventricular Rate:  95 PR Interval:  122 QRS Duration: 80 QT Interval:  360 QTC Calculation: 452 R Axis:   79 Text Interpretation: Normal sinus rhythm Normal ECG Interpretation limited secondary to artifact Confirmed by Zadie Rhine ([id number]) on 02/23/2022 2:24:48 AM         Imaging Studies ordered: I ordered imaging studies including chest x-ray I independently visualized and interpreted imaging. I agree with the radiologist interpretation   Medicines ordered and prescription drug management: Meds ordered this encounter  Medications   ipratropium-albuterol (DUONEB) 0.5-2.5 (3) MG/3ML nebulizer solution 9 mL   methylPREDNISolone sodium succinate (SOLU-MEDROL) 125 mg/2 mL injection 125 mg    -I have reviewed the patients home medicines and have made adjustments as needed  Critical interventions BiPAP, multiple DuoNebs, steroids  Cardiac Monitoring: The patient was maintained on a cardiac monitor.  I personally viewed and interpreted the cardiac monitored which showed an underlying rhythm of: Sinus tachycardia  Social Determinants of Health:  Factors impacting patients care include: none   Reevaluation: After the interventions noted above, I reevaluated the patient and found that they have :improved  Co morbidities that complicate the patient evaluation  Past Medical History:  Diagnosis Date   Arthritis    Asthma    Back pain    Depression    Psychosis (HCC)    Sickle cell trait (HCC)    Spinal stenosis    Tobacco abuse       Dispostion: I considered admission  for this patient, and given asthma exacerbation requiring BiPAP, patient will require admission     Final Clinical Impression(s) / ED Diagnoses Final diagnoses:  None     @PCDICTATION @    , MD 02/23/22 0451

## 2022-11-27 ENCOUNTER — Ambulatory Visit: Payer: Medicaid Other | Attending: *Deleted | Admitting: Physical Therapy

## 2023-04-30 ENCOUNTER — Ambulatory Visit (HOSPITAL_COMMUNITY): Payer: MEDICAID

## 2023-04-30 ENCOUNTER — Encounter (HOSPITAL_COMMUNITY): Payer: Self-pay | Admitting: Emergency Medicine

## 2023-04-30 ENCOUNTER — Other Ambulatory Visit: Payer: Self-pay

## 2023-04-30 ENCOUNTER — Ambulatory Visit (HOSPITAL_COMMUNITY)
Admission: EM | Admit: 2023-04-30 | Discharge: 2023-04-30 | Disposition: A | Discharge status: Home / Self Care | Payer: MEDICAID | Attending: Internal Medicine | Admitting: Internal Medicine

## 2023-04-30 DIAGNOSIS — J441 Chronic obstructive pulmonary disease with (acute) exacerbation: Secondary | ICD-10-CM | POA: Diagnosis not present

## 2023-04-30 MED ORDER — ALBUTEROL SULFATE HFA 108 (90 BASE) MCG/ACT IN AERS: 1.0000 | INHALATION_SPRAY | Freq: Four times a day (QID) | RESPIRATORY_TRACT | 0 refills | Status: AC | PRN

## 2023-04-30 MED ORDER — AZITHROMYCIN 250 MG PO TABS: 250.0000 mg | ORAL_TABLET | Freq: Every day | ORAL | 0 refills | Status: DC

## 2023-04-30 MED ORDER — PREDNISONE 20 MG PO TABS: 40.0000 mg | ORAL_TABLET | Freq: Every day | ORAL | 0 refills | Status: AC

## 2023-04-30 MED ORDER — BENZONATATE 200 MG PO CAPS: 200.0000 mg | ORAL_CAPSULE | Freq: Three times a day (TID) | ORAL | 0 refills | Status: DC | PRN

## 2023-04-30 NOTE — Discharge Instructions (Addendum)
Please maintain adequate hydration Take medications as prescribed Your chest x-ray is negative for pneumonia Smoke cessation is advised-this will help with your symptoms If you have worsening symptoms please return to urgent care to be reevaluated.

## 2023-04-30 NOTE — ED Triage Notes (Signed)
Chest congestion, sob, chest tightness, complains of headaches and runny nose and cough  Has had nyquil.

## 2023-04-30 NOTE — ED Provider Notes (Signed)
[facility]    CSN: [id number] Arrival date & time: 04/30/23  1442      History   Chief Complaint Chief Complaint  Patient presents with   Cough    HPI Roxsana Rothermich is a 42 y.o. female with a history of COPD on bronchodilator medications comes to urgent care with a 2-week history of cough productive of yellowish sputum, shortness of breath, chest tightness and wheezing.  Patient symptoms started with upper respiratory infection symptoms, headache.  She continues to have headaches intermittently and nasal congestion with postnasal drainage.  She is a smoker and smokes about half a pack of cigarettes a day.  No chest pain.  No nausea, vomiting or diarrhea.  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      Home Medications    Prior to Admission medications   Medication Sig Start Date End Date Taking? Authorizing Provider  albuterol (VENTOLIN HFA) 108 (90 Base) MCG/ACT inhaler Inhale 1 puff into the lungs every 6 (six) hours as needed for wheezing or shortness of breath. 04/30/23  Yes Nakoa Ganus, Britta Mccreedy, MD  azithromycin (ZITHROMAX) 250 MG tablet Take 1 tablet (250 mg total) by mouth daily. Take first 2 tablets together, then 1 every day until finished. 04/30/23  Yes Elesa Garman, Britta Mccreedy, MD  benzonatate  (TESSALON) 200 MG capsule Take 1 capsule (200 mg total) by mouth 3 (three) times daily as needed for cough. 04/30/23  Yes Leora Platt, Britta Mccreedy, MD  predniSONE (DELTASONE) 20 MG tablet Take 2 tablets (40 mg total) by mouth daily for 5 days. 04/30/23 05/05/23 Yes Teyah Rossy, Britta Mccreedy, MD  VYVANSE 30 MG capsule Take 30 mg by mouth daily. 04/22/23  Yes [provider]  cyanocobalamin (,VITAMIN B-12,) 1000 MCG/ML injection Inject 1,000 mcg into the muscle every 30 (thirty) days. 02/18/22   [provider]  fluticasone (FLOVENT HFA) 44 MCG/ACT inhaler Inhale 2 puffs into the lungs 2 (two) times daily.    [provider]  gabapentin (NEURONTIN) 100 MG capsule Take 100 mg by mouth at bedtime as needed (pain). 02/18/22   [provider]    Family History Family History  Problem Relation Age of Onset   Kidney Stones Father     Social History Social History   Tobacco Use   Smoking status: Every Day    Types: Cigars   Smokeless tobacco: Never  Vaping Use   Vaping status: Never Used  Substance Use Topics   Alcohol use: No   Drug use: No     Allergies   Patient has no known allergies.   Review of Systems Review of Systems As per HPI  Physical Exam Triage Vital Signs ED Triage Vitals  Encounter Vitals Group     BP 04/30/23 1633 98/62     Systolic BP Percentile --      Diastolic BP Percentile --      Pulse Rate 04/30/23 1633 63     Resp 04/30/23 1633 20     Temp 04/30/23 1633 97.9 F (36.6 C)     Temp Source 04/30/23 1633 Oral     SpO2 04/30/23 1633 97 %     Weight --      Height --      Head Circumference --      Peak Flow --      Pain Score 04/30/23 1630 6     Pain Loc --      Pain Education --      Exclude from Growth Chart --    No data found.  Updated Vital Signs BP 98/62 (BP Location: Left Arm)   Pulse 63   Temp 97.9 F (36.6 C) (Oral)   Resp 20   LMP 04/10/2023   SpO2 97%   Visual Acuity Right Eye Distance:   Left Eye Distance:    Bilateral Distance:    Right Eye Near:   Left Eye Near:    Bilateral Near:     Physical Exam Vitals and nursing note reviewed.  Constitutional:      General: She is not in acute distress.    Appearance: She is ill-appearing.  HENT:     Right Ear: Tympanic membrane normal.     Left Ear: Tympanic membrane normal.  Cardiovascular:     Rate and Rhythm: Normal rate and regular rhythm.     Pulses: Normal pulses.     Heart sounds: Normal heart sounds.  Pulmonary:     Effort: Pulmonary effort is normal.     Breath sounds: Normal breath sounds.     Comments: Decreased air entry in the lung bases bilaterally. Abdominal:     General: Bowel sounds are normal.     Palpations: Abdomen is soft.  Neurological:     Mental Status: She is alert.      UC Treatments / Results  Labs (all labs ordered are listed, but only abnormal results are displayed) Labs Reviewed - No data to display  EKG   Radiology No results found.  Procedures Procedures (including critical care time)  Medications Ordered in UC Medications - No data to display  Initial Impression / Assessment and Plan / UC Course  I have reviewed the triage vital signs and the nursing notes.  Pertinent labs & imaging results that were available during my care of the patient were reviewed by me and considered in my medical decision making (see chart for details).     1.  COPD with acute exacerbation: Chest x-ray is negative for acute lung infiltrate Prednisone 40 mg orally daily for 5 days Albuterol inhaler every 6 hours as needed for chest tightness or wheezing Course of Z-Pak Tessalon Perles as needed for cough Patient is advised to quit smoking Return precautions given. Final Clinical Impressions(s) / UC Diagnoses   Final diagnoses:  COPD with acute exacerbation [facility])     Discharge Instructions      Please maintain adequate hydration Take medications as prescribed Your chest x-ray is negative for  pneumonia Smoke cessation is advised-this will help with your symptoms If you have worsening symptoms please return to urgent care to be reevaluated.   ED Prescriptions  Medication Sig Dispense Auth. Provider   predniSONE (DELTASONE) 20 MG tablet Take 2 tablets (40 mg total) by mouth daily for 5 days. 10 tablet Mylee Falin, Britta Mccreedy, MD   albuterol (VENTOLIN HFA) 108 (90 Base) MCG/ACT inhaler Inhale 1 puff into the lungs every 6 (six) hours as needed for wheezing or shortness of breath. 6.7 g Melchor Kirchgessner, Britta Mccreedy, MD   azithromycin (ZITHROMAX) 250 MG tablet Take 1 tablet (250 mg total) by mouth daily. Take first 2 tablets together, then 1 every day until finished. 6 tablet Keyarra Rendall, Britta Mccreedy, MD   benzonatate (TESSALON) 200 MG capsule Take 1 capsule (200 mg total) by mouth 3 (three) times daily as needed for cough. 21 capsule Brittanee Ghazarian, Britta Mccreedy, MD      PDMP not reviewed this encounter.   Merrilee Jansky, MD 04/30/23 [id number]

## 2023-05-12 ENCOUNTER — Other Ambulatory Visit: Payer: Self-pay | Admitting: Nurse Practitioner

## 2023-05-12 DIAGNOSIS — Z1231 Encounter for screening mammogram for malignant neoplasm of breast: Secondary | ICD-10-CM

## 2023-07-31 ENCOUNTER — Emergency Department (HOSPITAL_COMMUNITY)
Admission: EM | Admit: 2023-07-31 | Discharge: 2023-07-31 | Discharge status: Against Medical Advice | Payer: MEDICAID | Attending: Emergency Medicine | Admitting: Emergency Medicine

## 2023-07-31 ENCOUNTER — Other Ambulatory Visit: Payer: Self-pay

## 2023-07-31 ENCOUNTER — Encounter (HOSPITAL_COMMUNITY): Payer: Self-pay

## 2023-07-31 DIAGNOSIS — Z5321 Procedure and treatment not carried out due to patient leaving prior to being seen by health care provider: Secondary | ICD-10-CM | POA: Insufficient documentation

## 2023-07-31 DIAGNOSIS — R102 Pelvic and perineal pain: Secondary | ICD-10-CM | POA: Insufficient documentation

## 2023-07-31 LAB — URINALYSIS, ROUTINE W REFLEX MICROSCOPIC
Bilirubin Urine: NEGATIVE
Glucose, UA: NEGATIVE mg/dL
Hgb urine dipstick: NEGATIVE
Ketones, ur: NEGATIVE mg/dL
Leukocytes,Ua: NEGATIVE
Nitrite: POSITIVE — AB
Protein, ur: NEGATIVE mg/dL
Specific Gravity, Urine: 1.015 (ref 1.005–1.030)
pH: 5 (ref 5.0–8.0)

## 2023-07-31 LAB — PREGNANCY, URINE: Preg Test, Ur: NEGATIVE

## 2023-07-31 NOTE — ED Triage Notes (Signed)
Pt arrived from home via POV c/o feeling movement in her pelvic area and pressure. Pt thinks she may be pregnant.

## 2023-07-31 NOTE — ED Notes (Signed)
Patient called for room. No response  

## 2023-07-31 NOTE — ED Notes (Signed)
Reviewed by Park Meo, PA

## 2023-08-14 ENCOUNTER — Other Ambulatory Visit: Payer: Self-pay | Admitting: Nurse Practitioner

## 2023-08-14 DIAGNOSIS — Z1231 Encounter for screening mammogram for malignant neoplasm of breast: Secondary | ICD-10-CM

## 2023-09-09 ENCOUNTER — Ambulatory Visit: Payer: MEDICAID

## 2023-10-20 ENCOUNTER — Ambulatory Visit (HOSPITAL_COMMUNITY)
Admission: EM | Admit: 2023-10-20 | Discharge: 2023-10-20 | Disposition: A | Discharge status: Home / Self Care | Payer: MEDICAID | Attending: Physician Assistant | Admitting: Physician Assistant

## 2023-10-20 ENCOUNTER — Encounter (HOSPITAL_COMMUNITY): Payer: Self-pay | Admitting: Emergency Medicine

## 2023-10-20 DIAGNOSIS — R079 Chest pain, unspecified: Secondary | ICD-10-CM

## 2023-10-20 DIAGNOSIS — R1013 Epigastric pain: Secondary | ICD-10-CM | POA: Diagnosis not present

## 2023-10-20 MED ORDER — ALUM & MAG HYDROXIDE-SIMETH 200-200-20 MG/5ML PO SUSP
ORAL | Status: AC
  Filled 2023-10-20: qty 30

## 2023-10-20 MED ORDER — LIDOCAINE VISCOUS HCL 2 % MT SOLN
OROMUCOSAL | Status: AC
  Filled 2023-10-20: qty 15

## 2023-10-20 MED ORDER — ALUM & MAG HYDROXIDE-SIMETH 200-200-20 MG/5ML PO SUSP
30.0000 mL | Freq: Once | ORAL | Status: AC
  Administered 2023-10-20: 30 mL via ORAL

## 2023-10-20 MED ORDER — LIDOCAINE VISCOUS HCL 2 % MT SOLN
15.0000 mL | Freq: Once | OROMUCOSAL | Status: AC
  Administered 2023-10-20: 15 mL via OROMUCOSAL

## 2023-10-20 NOTE — Discharge Instructions (Signed)
Go to the ER for further evaluation of your chest pain.

## 2023-10-20 NOTE — ED Provider Notes (Signed)
 [facility]    CSN: [id number] Arrival date & time: 10/20/23  1504      History   Chief Complaint Chief Complaint  Patient presents with   Chest Pain    HPI Norma Moreno is a 43 y.o. female.   Patient presented with left-sided chest pain and dizziness that began around 8 AM this morning.  Patient reports having trouble catching her breath when the pain occurs.  Denies shortness of breath, cough, congestion, weakness, and radiation of pain.   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      Home Medications    Prior to Admission medications   Medication Sig Start Date End Date Taking? Authorizing Provider  PROPRANOLOL HCL PO Take by mouth.   Yes [provider]  albuterol (VENTOLIN HFA) 108 (90 Base) MCG/ACT inhaler Inhale 1 puff into the lungs every 6 (six) hours as needed for wheezing or shortness of breath. 04/30/23   Merrilee Jansky, MD  azithromycin (ZITHROMAX) 250 MG tablet Take 1 tablet (250 mg total) by mouth daily. Take first 2 tablets together, then 1 every day until finished. 04/30/23   Lamptey, Britta Mccreedy, MD  benzonatate (TESSALON) 200 MG capsule Take 1 capsule (200 mg total) by mouth 3 (three) times daily as needed for cough. 04/30/23   Lamptey, Britta Mccreedy, MD  cyanocobalamin (,VITAMIN B-12,) 1000 MCG/ML injection Inject 1,000 mcg into the  muscle every 30 (thirty) days. 02/18/22   [provider]  fluticasone (FLOVENT HFA) 44 MCG/ACT inhaler Inhale 2 puffs into the lungs 2 (two) times daily.    [provider]  gabapentin (NEURONTIN) 100 MG capsule Take 100 mg by mouth at bedtime as needed (pain). 02/18/22   [provider]  VYVANSE 30 MG capsule Take 30 mg by mouth daily. 04/22/23   [provider]    Family History Family History  Problem Relation Age of Onset   Kidney Stones Father     Social History Social History   Tobacco Use   Smoking status: Every Day    Types: Cigars   Smokeless tobacco: Never  Vaping Use   Vaping status: Never Used  Substance Use Topics   Alcohol use: No   Drug use: No     Allergies   Patient has no known allergies.   Review of Systems Review of Systems  Cardiovascular:  Positive for chest pain.   Per HPI  Physical Exam Triage Vital Signs ED Triage Vitals  Encounter Vitals Group     BP 10/20/23 1530 122/69     Systolic BP Percentile --      Diastolic BP Percentile --      Pulse Rate 10/20/23 1530 99  Resp 10/20/23 1530 18     Temp 10/20/23 1530 97.7 F (36.5 C)     Temp Source 10/20/23 1530 Oral     SpO2 10/20/23 1530 100 %     Weight --      Height --      Head Circumference --      Peak Flow --      Pain Score 10/20/23 1544 10     Pain Loc --      Pain Education --      Exclude from Growth Chart --    No data found.  Updated Vital Signs BP 122/69 (BP Location: Right Arm)   Pulse 99   Temp 97.7 F (36.5 C) (Oral)   Resp 18   SpO2 100%   Visual Acuity Right Eye Distance:   Left Eye Distance:   Bilateral Distance:    Right Eye Near:   Left Eye Near:    Bilateral Near:     Physical Exam Vitals and nursing note reviewed.  Constitutional:      General: She is awake. She is not in acute distress.    Appearance: Normal appearance. She is well-developed and well-groomed. She is not ill-appearing.  Cardiovascular:      Rate and Rhythm: Normal rate and regular rhythm.  Pulmonary:     Effort: Pulmonary effort is normal.     Breath sounds: Normal breath sounds.  Abdominal:     General: Abdomen is flat. Bowel sounds are normal.     Palpations: Abdomen is soft.     Tenderness: There is abdominal tenderness in the epigastric area.  Musculoskeletal:     Cervical back: Normal range of motion and neck supple.  Skin:    General: Skin is warm and dry.  Neurological:     General: No focal deficit present.     Mental Status: She is alert and oriented to person, place, and time. Mental status is at baseline.     GCS: GCS eye subscore is 4. GCS verbal subscore is 5. GCS motor subscore is 6.     Cranial Nerves: Cranial nerves 2-12 are intact.     Sensory: Sensation is intact.     Motor: Motor function is intact.     Coordination: Coordination is intact.     Gait: Gait is intact.  Psychiatric:        Behavior: Behavior is cooperative.      UC Treatments / Results  Labs (all labs ordered are listed, but only abnormal results are displayed) Labs Reviewed - No data to display  EKG   Radiology No results found.  Procedures Procedures (including critical care time)  Medications Ordered in UC Medications  alum & mag hydroxide-simeth (MAALOX/MYLANTA) 200-200-20 MG/5ML suspension 30 mL (30 mLs Oral Given 10/20/23 1607)  lidocaine (XYLOCAINE) 2 % viscous mouth solution 15 mL (15 mLs Mouth/Throat Given 10/20/23 1607)    Initial Impression / Assessment and Plan / UC Course  I have reviewed the triage vital signs and the nursing notes.  Pertinent labs & imaging results that were available during my care of the patient were reviewed by me and considered in my medical decision making (see chart for details).     Patient presented with left-sided chest pain and dizziness that began around 8 AM this morning.  Patient reports having trouble catching her breath when the pain occurs.  Denies any other  symptoms.  Upon assessment patient is tender upon palpation to epigastric region.  Lungs  clear bilaterally to auscultation.  No neurodeficits noted. GCS 15. EKG revealed NSR without ST segment elevation, depression, or other acute cardiac finding.   Given GI cocktail to rule out indigestion as cause of chest and epigastric pain.  Patient states that her pain worsened after receiving this medication.  Recommended that patient be seen in ER due to severity of chest pain and unrelieved symptoms of medication.  Patient agreeable to plan at this time.  Patient is stable at this time to arrived to ER via POV. Final Clinical Impressions(s) / UC Diagnoses   Final diagnoses:  Chest pain, unspecified type  Epigastric pain     Discharge Instructions      Go to the ER for further evaluation of your chest pain.     ED Prescriptions   None    PDMP not reviewed this encounter.   Wynonia Lawman A, NP 10/20/23 [phone]

## 2023-10-20 NOTE — ED Triage Notes (Signed)
 Chest pain and dizziness onset this morning around 0800. Pain in the left side of the chest no radiation. Patient also having SOB with the pain. History of COPD.

## 2023-10-20 NOTE — ED Notes (Signed)
 Patient is being discharged from the Urgent Care and sent to the Emergency Department via personal opperated vehicle. Per Wynonia Lawman NP, patient is in need of higher level of care due to chest pain unresolved with intervention. Patient is aware and verbalizes understanding of plan of care.  Vitals:   10/20/23 1530  BP: 122/69  Pulse: 99  Resp: 18  Temp: 97.7 F (36.5 C)  SpO2: 100%

## 2024-02-15 ENCOUNTER — Encounter (HOSPITAL_COMMUNITY): Payer: Self-pay | Admitting: *Deleted

## 2024-02-15 ENCOUNTER — Ambulatory Visit (HOSPITAL_COMMUNITY)
Admission: EM | Admit: 2024-02-15 | Discharge: 2024-02-15 | Disposition: A | Discharge status: Home / Self Care | Payer: MEDICAID

## 2024-02-15 DIAGNOSIS — L089 Local infection of the skin and subcutaneous tissue, unspecified: Secondary | ICD-10-CM | POA: Diagnosis not present

## 2024-02-15 DIAGNOSIS — L729 Follicular cyst of the skin and subcutaneous tissue, unspecified: Secondary | ICD-10-CM

## 2024-02-15 MED ORDER — DOXYCYCLINE HYCLATE 100 MG PO CAPS: 100.0000 mg | ORAL_CAPSULE | Freq: Two times a day (BID) | ORAL | 0 refills | Status: AC

## 2024-02-15 NOTE — Discharge Instructions (Addendum)
 Please take medication as prescribed. Take with food to avoid upset stomach. Finish the full course!  Continue ibuprofen , tylenol , and warm compress  Call the surgery center to make an appointment for follow up

## 2024-02-15 NOTE — ED Provider Notes (Signed)
 [facility]    CSN: [id number] Arrival date & time: 02/15/24  1440      History   Chief Complaint Chief Complaint  Patient presents with   Cyst    HPI Norma Moreno is a 43 y.o. female.  Reports a cyst in front of the left ear for 4 years Over the last week it started to become painful and more swollen. Now rating 8/10 pain No drainage from area. 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      Home Medications    Prior to Admission medications   Medication Sig Start Date End Date Taking? Authorizing Provider  ALPRAZolam (XANAX) 1 MG tablet Take 1 mg by mouth daily. 09/15/23  Yes [provider]  ARIPiprazole  (ABILIFY ) 15 MG tablet TK 1/2 T PO HS   Yes [provider]  doxycycline  (VIBRAMYCIN ) 100 MG capsule Take 1 capsule (100 mg total) by mouth 2 (two) times daily for 7 days. 02/15/24 02/22/24 Yes Brooks Kinnan, Asberry, PA-C  gabapentin (NEURONTIN) 100 MG capsule Take 100 mg by mouth at bedtime as needed (pain). 02/18/22  Yes [provider]  PROPRANOLOL HCL PO Take by mouth.   Yes [provider]  VYVANSE 30 MG capsule Take 30 mg by mouth daily. 04/22/23  Yes [provider]  albuterol  (VENTOLIN  HFA) 108 (90 Base) MCG/ACT inhaler Inhale 1 puff into the  lungs every 6 (six) hours as needed for wheezing or shortness of breath. 04/30/23   Lamptey, Aleene KIDD, MD  cyanocobalamin (,VITAMIN B-12,) 1000 MCG/ML injection Inject 1,000 mcg into the muscle every 30 (thirty) days. 02/18/22   [provider]  fluticasone (FLOVENT HFA) 44 MCG/ACT inhaler Inhale 2 puffs into the lungs 2 (two) times daily.    [provider]    Family History Family History  Problem Relation Age of Onset   Kidney Stones Father     Social History Social History   Tobacco Use   Smoking status: Every Day    Types: Cigars   Smokeless tobacco: Never  Vaping Use   Vaping status: Never Used  Substance Use Topics   Alcohol use: No   Drug use: No     Allergies   Patient has no known allergies.   Review of Systems Review of Systems As per HPI  Physical Exam Triage Vital Signs ED Triage Vitals  Encounter Vitals Group     BP 02/15/24 1601 103/64     Girls Systolic BP Percentile --      Girls Diastolic BP Percentile --      Boys Systolic BP Percentile --      Boys Diastolic BP  Percentile --      Pulse Rate 02/15/24 1601 78     Resp 02/15/24 1601 18     Temp 02/15/24 1601 98.2 F (36.8 C)     Temp Source 02/15/24 1601 Oral     SpO2 02/15/24 1601 99 %     Weight --      Height --      Head Circumference --      Peak Flow --      Pain Score 02/15/24 1558 8     Pain Loc --      Pain Education --      Exclude from Growth Chart --    No data found.  Updated Vital Signs BP 103/64 (BP Location: Left Arm)   Pulse 78   Temp 98.2 F (36.8 C) (Oral)   Resp 18   LMP 01/14/2024 (Exact Date)   SpO2 99%    Physical Exam Vitals and nursing note reviewed.  Constitutional:      General: She is not in acute distress.    Appearance: Normal appearance.  HENT:     Head:      Comments: Area of tenderness, fluctuance and induration, erythema just anterior to left earlobe. 1 cm in diameter     Mouth/Throat:     Pharynx: Oropharynx is clear.    Cardiovascular:     Rate and Rhythm: Normal rate and regular rhythm.     Pulses: Normal pulses.     Heart sounds: Normal heart sounds.  Pulmonary:     Effort: Pulmonary effort is normal.     Breath sounds: Normal breath sounds.   Neurological:     Mental Status: She is alert and oriented to person, place, and time.      UC Treatments / Results  Labs (all labs ordered are listed, but only abnormal results are displayed) Labs Reviewed - No data to display  EKG  Radiology No results found.  Procedures Procedures (including critical care time)  Medications Ordered in UC Medications - No data to display  Initial Impression / Assessment and Plan / UC Course  I have reviewed the triage vital signs and the nursing notes.  Pertinent labs & imaging results that were available during my care of the patient were reviewed by me and considered in my medical decision making (see chart for details).  Cyst present for 4 years, becoming painful and tender over the last week.  Treat as infected cyst with doxycycline  twice daily for 7 days.  Discussed other supportive care and pain control.  Advise following up with general surgery for cyst removal if desired.  Patient agrees with plan, no questions  Final Clinical Impressions(s) / UC Diagnoses   Final diagnoses:  Infected cyst of skin     Discharge Instructions      Please take medication as prescribed. Take with food to avoid upset stomach. Finish the full course!  Continue ibuprofen , tylenol , and warm compress  Call the surgery center to make an appointment for follow up      ED Prescriptions     Medication Sig Dispense Auth. Provider   doxycycline  (VIBRAMYCIN ) 100 MG capsule Take 1 capsule (100 mg total) by mouth 2 (two) times daily for 7 days. 14 capsule Daniil Labarge, Asberry, PA-C      PDMP not reviewed this encounter.   Jeryl Asberry, PA-C 02/15/24 1642

## 2024-02-15 NOTE — ED Triage Notes (Signed)
 Pt states she has had a cyst by her left ear X 4 years but it started to bother her about a week ago. She has been using heat on the area with alcohol.
